# Patient Record
Sex: Female | Born: 1945 | Race: White | Hispanic: No | Marital: Married | Smoking: Former smoker
Health system: Southern US, Community
[De-identification: ages and names within clinical notes are randomized; demographics above are authoritative.]

## PROBLEM LIST (undated history)

## (undated) DIAGNOSIS — E039 Hypothyroidism, unspecified: Secondary | ICD-10-CM

## (undated) DIAGNOSIS — Z780 Asymptomatic menopausal state: Secondary | ICD-10-CM

## (undated) DIAGNOSIS — Z9109 Other allergy status, other than to drugs and biological substances: Secondary | ICD-10-CM

## (undated) DIAGNOSIS — F329 Major depressive disorder, single episode, unspecified: Secondary | ICD-10-CM

## (undated) DIAGNOSIS — F32A Depression, unspecified: Secondary | ICD-10-CM

## (undated) DIAGNOSIS — N6459 Other signs and symptoms in breast: Secondary | ICD-10-CM

## (undated) DIAGNOSIS — I10 Essential (primary) hypertension: Secondary | ICD-10-CM

## (undated) HISTORY — DX: Depression, unspecified: F32.A

## (undated) HISTORY — DX: Hypothyroidism, unspecified: E03.9

## (undated) HISTORY — PX: OTHER SURGICAL HISTORY: SHX169

## (undated) HISTORY — DX: Other allergy status, other than to drugs and biological substances: Z91.09

## (undated) HISTORY — DX: Major depressive disorder, single episode, unspecified: F32.9

## (undated) HISTORY — DX: Essential (primary) hypertension: I10

## (undated) HISTORY — DX: Asymptomatic menopausal state: Z78.0

---

## 2000-02-15 ENCOUNTER — Encounter: Payer: Self-pay | Admitting: Family Medicine

## 2000-02-15 ENCOUNTER — Encounter: Admission: RE | Admit: 2000-02-15 | Discharge: 2000-02-15 | Payer: Self-pay | Admitting: Family Medicine

## 2001-02-15 ENCOUNTER — Encounter: Admission: RE | Admit: 2001-02-15 | Discharge: 2001-02-15 | Payer: Self-pay | Admitting: Family Medicine

## 2001-02-15 ENCOUNTER — Encounter: Payer: Self-pay | Admitting: Family Medicine

## 2001-11-16 ENCOUNTER — Encounter: Payer: Self-pay | Admitting: Family Medicine

## 2001-11-16 ENCOUNTER — Encounter: Admission: RE | Admit: 2001-11-16 | Discharge: 2001-11-16 | Payer: Self-pay | Admitting: Family Medicine

## 2002-02-18 ENCOUNTER — Encounter: Payer: Self-pay | Admitting: Family Medicine

## 2002-02-18 ENCOUNTER — Encounter: Admission: RE | Admit: 2002-02-18 | Discharge: 2002-02-18 | Payer: Self-pay | Admitting: Family Medicine

## 2003-01-22 ENCOUNTER — Ambulatory Visit (HOSPITAL_COMMUNITY): Admission: RE | Admit: 2003-01-22 | Discharge: 2003-01-22 | Payer: Self-pay | Admitting: Gastroenterology

## 2003-02-20 ENCOUNTER — Encounter: Payer: Self-pay | Admitting: Family Medicine

## 2003-02-20 ENCOUNTER — Encounter: Admission: RE | Admit: 2003-02-20 | Discharge: 2003-02-20 | Payer: Self-pay | Admitting: Family Medicine

## 2004-02-24 ENCOUNTER — Encounter: Admission: RE | Admit: 2004-02-24 | Discharge: 2004-02-24 | Payer: Self-pay | Admitting: Family Medicine

## 2005-03-02 ENCOUNTER — Encounter: Admission: RE | Admit: 2005-03-02 | Discharge: 2005-03-02 | Payer: Self-pay | Admitting: Family Medicine

## 2006-03-07 ENCOUNTER — Encounter: Admission: RE | Admit: 2006-03-07 | Discharge: 2006-03-07 | Payer: Self-pay | Admitting: Family Medicine

## 2006-03-20 ENCOUNTER — Encounter: Admission: RE | Admit: 2006-03-20 | Discharge: 2006-03-20 | Payer: Self-pay | Admitting: Family Medicine

## 2007-04-12 ENCOUNTER — Encounter: Admission: RE | Admit: 2007-04-12 | Discharge: 2007-04-12 | Payer: Self-pay | Admitting: Family Medicine

## 2008-01-02 ENCOUNTER — Other Ambulatory Visit: Admission: RE | Admit: 2008-01-02 | Discharge: 2008-01-02 | Payer: Self-pay | Admitting: Family Medicine

## 2008-04-16 ENCOUNTER — Encounter: Admission: RE | Admit: 2008-04-16 | Discharge: 2008-04-16 | Payer: Self-pay | Admitting: Family Medicine

## 2008-08-04 ENCOUNTER — Ambulatory Visit (HOSPITAL_BASED_OUTPATIENT_CLINIC_OR_DEPARTMENT_OTHER): Admission: RE | Admit: 2008-08-04 | Discharge: 2008-08-04 | Payer: Self-pay | Admitting: Urology

## 2009-04-20 ENCOUNTER — Encounter: Admission: RE | Admit: 2009-04-20 | Discharge: 2009-04-20 | Payer: Self-pay | Admitting: Family Medicine

## 2010-01-07 ENCOUNTER — Other Ambulatory Visit: Admission: RE | Admit: 2010-01-07 | Discharge: 2010-01-07 | Payer: Self-pay | Admitting: Family Medicine

## 2010-04-27 ENCOUNTER — Encounter: Admission: RE | Admit: 2010-04-27 | Discharge: 2010-04-27 | Payer: Self-pay | Admitting: Family Medicine

## 2010-06-13 ENCOUNTER — Encounter: Payer: Self-pay | Admitting: Family Medicine

## 2010-09-02 LAB — POCT I-STAT 4, (NA,K, GLUC, HGB,HCT)
Glucose, Bld: 105 mg/dL — ABNORMAL HIGH (ref 70–99)
HCT: 41 % (ref 36.0–46.0)
Hemoglobin: 13.9 g/dL (ref 12.0–15.0)
Potassium: 3.7 mEq/L (ref 3.5–5.1)
Sodium: 141 mEq/L (ref 135–145)

## 2010-10-05 NOTE — Op Note (Signed)
NAMECASIMIRA, Maria Cortez              ACCOUNT NO.:  [id number]   MEDICAL RECORD NO.:  [id number]          PATIENT TYPE:  AMB   LOCATION:  [facility]                         FACILITY:  [facility]   PHYSICIAN:  Mark C. Vernie Ammons, M.D.  DATE OF BIRTH:  [birth year]   DATE OF PROCEDURE:  08/04/2008  DATE OF DISCHARGE:                               OPERATIVE REPORT   PREOPERATIVE DIAGNOSIS:  Stress urinary incontinence.   POSTOPERATIVE DIAGNOSIS:  Stress urinary incontinence.   PROCEDURE:  Transobturator sling.   SURGEON:  Mark C. Vernie Ammons, M.D.   ANESTHESIA:  General.   SPECIMENS:  None.   BLOOD LOSS:  Approximately 100 mL.   DRAINS:  None.   COMPLICATIONS:  None.   INDICATIONS:  The patient is a 65 year old female with stress urinary  incontinence.  She was found to have a mild cystocele on exam and loss  of urethrovesical junction support.  We have discussed treatment options  as well as risks, complications, alternatives and limitations to the  surgery.  She understands and has elected to proceed.   DESCRIPTION OF OPERATION:  After informed consent, the patient was  brought to the major OR, placed on the table and administered general  anesthesia, then moved to the dorsal lithotomy position.  Her genitalia  was sterilely prepped and draped and an official time-out was then  performed.  Next, a 16-French Foley catheter was placed in her bladder  and the bladder was fully drained and a weighted speculum was placed in  the vagina.  I evaluated her cystocele and found that really what she  had was loss of ureterovesical junction support but no real cystocele  component worthy of any form of surgical treatment.  I therefore elected  not to perform any form of anterior repair.  I therefore injected the  subvaginal mucosa with Marcaine with epinephrine, and after allowing  adequate time for epinephrine effect, I made a midline incision over the  mid urethra that was noted by palpating the catheter  pulled snugly  against the bladder neck.  This was then deepened laterally on each side  until I could feel the undersurface of the symphysis pubis through the  incision with my index finger.   Attention was then directed to the inner thigh region where I chose a  location by palpation of the obturator fossa and measured 5 mm lateral  to the midline at the level of the clitoris and made a mark on right and  left sides and then injected Marcaine with epinephrine in this location  as well.  Stab incisions were then made and I then drained the bladder  once again through the Foley catheter.   With the bladder completely drained, I passed the trocar first on the  right side and then left side through the obturator fossa and back  behind the symphysis pubis, and brought it out at the mid urethral level  by palpating this against my index finger.  As the trocar was passed  through, I made sure there was no buttonholing of the vaginal fornix.  I  then attached the sling  material to the trocar and brought these back  out through the stab incisions.   The Foley catheter was removed and the 22-French cystoscope with 70-  degrees lens was inserted in the bladder.  The bladder was then fully  inspected and noted to be free of any tumor, stones or inflammatory  lesions.  There was no evidence of injury, perforation or foreign body  within the bladder.   The cystoscope was removed and the Foley catheter was reinserted.  I  then squirted antibiotic solution through the plastic covering to the  sling and then placed a hemostat under the sling and removed the sheath  on the left side, then made sure there was correct tension, and removed  the sheath on the right-hand side.  I rechecked the tension and noted  there was no undue tension on the urethra with the sling lying at the  mid urethral level.  I therefore irrigated the wound copiously with  antibiotic solution and closed the vaginal incision  with a running  locking 2-0 Vicryl suture.  The Foley catheter was removed and the  patient was awakened and taken to recovery room in stable and  satisfactory condition.  She tolerated the procedure well and there were  no intraoperative complications.   She will be given a prescription for Tylox #36 and Cipro 500 mg b.i.d.  for 5 days.  She will return to my office in followup in 1 week.      Mark C. Vernie Ammons, M.D.  Electronically Signed     MCO/MEDQ  D:  08/04/2008  T:  08/04/2008  Job:  [id number]

## 2010-10-08 NOTE — Op Note (Signed)
   [name], Maria Cortez                          ACCOUNT NO.:  [id number]   MEDICAL RECORD NO.:  [id number]                   PATIENT TYPE:  AMB   LOCATION:  ENDO                                 FACILITY:  [facility]   PHYSICIAN:  John C. Madilyn Fireman, M.D.                 DATE OF BIRTH:  [birth year]   DATE OF PROCEDURE:  01/22/2003  DATE OF DISCHARGE:                                 OPERATIVE REPORT   PROCEDURE:  Colonoscopy.   INDICATION FOR PROCEDURE:  Colon cancer screening in a 65 year old patient  with no recent colon screening.   DESCRIPTION OF PROCEDURE:  The patient was placed in the left lateral  decubitus position and placed on the pulse monitor with continuous low-flow  oxygen delivered by nasal cannula.  She was sedated with 75 mcg IV fentanyl  and 6 mg IV Versed.  The Olympus video colonoscope was inserted into the  rectum and advanced to the cecum, confirmed by transillumination at  McBurney's point and visualization of the ileocecal valve and appendiceal  orifice.  The prep was excellent.  The cecum, ascending, transverse,  descending, and sigmoid colon all appeared normal with no masses, polyps,  diverticula, or other mucosal abnormalities.  The rectum likewise appeared  normal.  Retroflexed view of the anus revealed no obvious internal  hemorrhoids.  The scope was then withdrawn and the patient returned to the  recovery room in stable condition.  She tolerated the procedure well, and  there were no immediate complications.   IMPRESSION:  Normal colonoscopy.   PLAN:  Next colon screening by sigmoidoscopy in five years.                                               John C. Madilyn Fireman, M.D.    JCH/MEDQ  D:  01/22/2003  T:  01/23/2003  Job:  [id number]   cc:   Molly Maduro L. Foy [place], M.D.  [address] [place]  [place] [address]  Fax: [phone]

## 2011-02-07 ENCOUNTER — Other Ambulatory Visit: Payer: Self-pay | Admitting: Family Medicine

## 2011-02-07 ENCOUNTER — Other Ambulatory Visit (HOSPITAL_COMMUNITY)
Admission: RE | Admit: 2011-02-07 | Discharge: 2011-02-07 | Disposition: A | Discharge status: Home / Self Care | Payer: Medicare Other | Source: Ambulatory Visit | Attending: Family Medicine | Admitting: Family Medicine

## 2011-02-07 DIAGNOSIS — Z124 Encounter for screening for malignant neoplasm of cervix: Secondary | ICD-10-CM | POA: Insufficient documentation

## 2011-02-07 DIAGNOSIS — Z1159 Encounter for screening for other viral diseases: Secondary | ICD-10-CM | POA: Insufficient documentation

## 2011-03-24 ENCOUNTER — Other Ambulatory Visit: Payer: Self-pay | Admitting: Family Medicine

## 2011-03-24 DIAGNOSIS — Z1231 Encounter for screening mammogram for malignant neoplasm of breast: Secondary | ICD-10-CM

## 2011-05-02 ENCOUNTER — Ambulatory Visit
Admission: RE | Admit: 2011-05-02 | Discharge: 2011-05-02 | Disposition: A | Discharge status: Home / Self Care | Payer: Medicare Other | Source: Ambulatory Visit | Attending: Family Medicine | Admitting: Family Medicine

## 2011-05-02 DIAGNOSIS — Z1231 Encounter for screening mammogram for malignant neoplasm of breast: Secondary | ICD-10-CM

## 2011-06-09 ENCOUNTER — Other Ambulatory Visit: Payer: Self-pay | Admitting: Dermatology

## 2011-09-08 ENCOUNTER — Ambulatory Visit: Payer: Medicare Other | Attending: Family Medicine | Admitting: Physical Therapy

## 2011-09-08 DIAGNOSIS — M2560 Stiffness of unspecified joint, not elsewhere classified: Secondary | ICD-10-CM | POA: Insufficient documentation

## 2011-09-08 DIAGNOSIS — M542 Cervicalgia: Secondary | ICD-10-CM | POA: Insufficient documentation

## 2011-09-08 DIAGNOSIS — IMO0001 Reserved for inherently not codable concepts without codable children: Secondary | ICD-10-CM | POA: Insufficient documentation

## 2011-09-08 DIAGNOSIS — M25519 Pain in unspecified shoulder: Secondary | ICD-10-CM | POA: Insufficient documentation

## 2011-09-09 ENCOUNTER — Ambulatory Visit: Payer: Medicare Other | Admitting: Physical Therapy

## 2011-09-12 ENCOUNTER — Ambulatory Visit: Payer: Medicare Other

## 2011-09-15 ENCOUNTER — Ambulatory Visit: Payer: Medicare Other | Admitting: Physical Therapy

## 2011-09-20 ENCOUNTER — Ambulatory Visit: Payer: Medicare Other | Admitting: Physical Therapy

## 2011-09-22 ENCOUNTER — Ambulatory Visit: Payer: Medicare Other | Attending: Family Medicine | Admitting: Physical Therapy

## 2011-09-22 DIAGNOSIS — M2560 Stiffness of unspecified joint, not elsewhere classified: Secondary | ICD-10-CM | POA: Insufficient documentation

## 2011-09-22 DIAGNOSIS — IMO0001 Reserved for inherently not codable concepts without codable children: Secondary | ICD-10-CM | POA: Insufficient documentation

## 2011-09-22 DIAGNOSIS — M25519 Pain in unspecified shoulder: Secondary | ICD-10-CM | POA: Insufficient documentation

## 2011-09-22 DIAGNOSIS — M542 Cervicalgia: Secondary | ICD-10-CM | POA: Insufficient documentation

## 2011-09-27 ENCOUNTER — Ambulatory Visit: Payer: Medicare Other | Admitting: Physical Therapy

## 2011-09-29 ENCOUNTER — Ambulatory Visit: Payer: Medicare Other | Admitting: Physical Therapy

## 2011-10-04 ENCOUNTER — Ambulatory Visit: Payer: Medicare Other | Admitting: Physical Therapy

## 2011-10-06 ENCOUNTER — Ambulatory Visit: Payer: Medicare Other | Admitting: Physical Therapy

## 2011-11-07 ENCOUNTER — Other Ambulatory Visit (HOSPITAL_COMMUNITY): Payer: Self-pay | Admitting: Family Medicine

## 2011-11-07 ENCOUNTER — Ambulatory Visit (HOSPITAL_COMMUNITY)
Admission: RE | Admit: 2011-11-07 | Discharge: 2011-11-07 | Disposition: A | Discharge status: Home / Self Care | Payer: Medicare Other | Source: Ambulatory Visit | Attending: Family Medicine | Admitting: Family Medicine

## 2011-11-07 DIAGNOSIS — R911 Solitary pulmonary nodule: Secondary | ICD-10-CM | POA: Insufficient documentation

## 2011-11-07 DIAGNOSIS — R0902 Hypoxemia: Secondary | ICD-10-CM

## 2011-11-07 DIAGNOSIS — I700 Atherosclerosis of aorta: Secondary | ICD-10-CM | POA: Insufficient documentation

## 2011-11-07 DIAGNOSIS — I517 Cardiomegaly: Secondary | ICD-10-CM | POA: Insufficient documentation

## 2011-11-07 MED ORDER — IOHEXOL 350 MG/ML SOLN
100.0000 mL | Freq: Once | INTRAVENOUS | Status: AC | PRN
  Administered 2011-11-07: 80 mL via INTRAVENOUS

## 2011-11-07 MED ORDER — IOHEXOL 350 MG/ML SOLN: 100.0000 mL | Freq: Once | INTRAVENOUS | Status: AC | PRN

## 2012-03-22 ENCOUNTER — Other Ambulatory Visit: Payer: Self-pay | Admitting: Family Medicine

## 2012-03-22 DIAGNOSIS — Z1231 Encounter for screening mammogram for malignant neoplasm of breast: Secondary | ICD-10-CM

## 2012-05-03 ENCOUNTER — Ambulatory Visit
Admission: RE | Admit: 2012-05-03 | Discharge: 2012-05-03 | Disposition: A | Discharge status: Home / Self Care | Payer: Medicare Other | Source: Ambulatory Visit | Attending: Family Medicine | Admitting: Family Medicine

## 2012-05-03 DIAGNOSIS — Z1231 Encounter for screening mammogram for malignant neoplasm of breast: Secondary | ICD-10-CM

## 2012-05-09 ENCOUNTER — Other Ambulatory Visit: Payer: Self-pay | Admitting: Family Medicine

## 2012-05-09 DIAGNOSIS — R928 Other abnormal and inconclusive findings on diagnostic imaging of breast: Secondary | ICD-10-CM

## 2012-05-21 ENCOUNTER — Ambulatory Visit
Admission: RE | Admit: 2012-05-21 | Discharge: 2012-05-21 | Disposition: A | Discharge status: Home / Self Care | Payer: Medicare Other | Source: Ambulatory Visit | Attending: Family Medicine | Admitting: Family Medicine

## 2012-05-21 DIAGNOSIS — R928 Other abnormal and inconclusive findings on diagnostic imaging of breast: Secondary | ICD-10-CM

## 2012-06-18 ENCOUNTER — Other Ambulatory Visit: Payer: Self-pay | Admitting: Dermatology

## 2012-10-02 ENCOUNTER — Ambulatory Visit: Payer: Medicare Other

## 2012-10-04 ENCOUNTER — Ambulatory Visit: Payer: Medicare Other | Attending: Family Medicine

## 2012-10-04 DIAGNOSIS — IMO0001 Reserved for inherently not codable concepts without codable children: Secondary | ICD-10-CM | POA: Insufficient documentation

## 2012-10-04 DIAGNOSIS — M25519 Pain in unspecified shoulder: Secondary | ICD-10-CM | POA: Insufficient documentation

## 2012-10-04 DIAGNOSIS — R5381 Other malaise: Secondary | ICD-10-CM | POA: Insufficient documentation

## 2012-10-04 DIAGNOSIS — M25619 Stiffness of unspecified shoulder, not elsewhere classified: Secondary | ICD-10-CM | POA: Insufficient documentation

## 2012-10-09 ENCOUNTER — Ambulatory Visit: Payer: Medicare Other | Admitting: Physical Therapy

## 2012-10-11 ENCOUNTER — Ambulatory Visit: Payer: Medicare Other | Admitting: Physical Therapy

## 2012-10-18 ENCOUNTER — Ambulatory Visit: Payer: Medicare Other

## 2012-10-22 ENCOUNTER — Ambulatory Visit: Payer: Medicare Other | Attending: Family Medicine | Admitting: Physical Therapy

## 2012-10-22 DIAGNOSIS — R5381 Other malaise: Secondary | ICD-10-CM | POA: Insufficient documentation

## 2012-10-22 DIAGNOSIS — IMO0001 Reserved for inherently not codable concepts without codable children: Secondary | ICD-10-CM | POA: Insufficient documentation

## 2012-10-22 DIAGNOSIS — M25619 Stiffness of unspecified shoulder, not elsewhere classified: Secondary | ICD-10-CM | POA: Insufficient documentation

## 2012-10-22 DIAGNOSIS — M25519 Pain in unspecified shoulder: Secondary | ICD-10-CM | POA: Insufficient documentation

## 2012-10-25 ENCOUNTER — Ambulatory Visit: Payer: Medicare Other | Admitting: Physical Therapy

## 2012-10-29 ENCOUNTER — Encounter: Payer: Medicare Other | Admitting: Physical Therapy

## 2012-11-01 ENCOUNTER — Ambulatory Visit: Payer: Medicare Other | Admitting: Physical Therapy

## 2012-11-05 ENCOUNTER — Ambulatory Visit: Payer: Medicare Other | Admitting: Physical Therapy

## 2012-11-08 ENCOUNTER — Ambulatory Visit: Payer: Medicare Other

## 2013-03-19 ENCOUNTER — Other Ambulatory Visit: Payer: Self-pay

## 2013-03-19 DIAGNOSIS — Z1231 Encounter for screening mammogram for malignant neoplasm of breast: Secondary | ICD-10-CM

## 2013-05-06 ENCOUNTER — Ambulatory Visit
Admission: RE | Admit: 2013-05-06 | Discharge: 2013-05-06 | Disposition: A | Discharge status: Home / Self Care | Payer: Medicare Other | Source: Ambulatory Visit

## 2013-05-06 DIAGNOSIS — Z1231 Encounter for screening mammogram for malignant neoplasm of breast: Secondary | ICD-10-CM

## 2014-04-07 ENCOUNTER — Other Ambulatory Visit: Payer: Self-pay

## 2014-04-07 DIAGNOSIS — Z1231 Encounter for screening mammogram for malignant neoplasm of breast: Secondary | ICD-10-CM

## 2014-05-07 ENCOUNTER — Ambulatory Visit: Payer: Medicare Other

## 2014-05-22 ENCOUNTER — Ambulatory Visit
Admission: RE | Admit: 2014-05-22 | Discharge: 2014-05-22 | Disposition: A | Discharge status: Home / Self Care | Payer: Medicare HMO | Source: Ambulatory Visit

## 2014-05-22 DIAGNOSIS — Z1231 Encounter for screening mammogram for malignant neoplasm of breast: Secondary | ICD-10-CM

## 2014-05-27 ENCOUNTER — Other Ambulatory Visit: Payer: Self-pay | Admitting: Family Medicine

## 2014-05-27 DIAGNOSIS — R928 Other abnormal and inconclusive findings on diagnostic imaging of breast: Secondary | ICD-10-CM

## 2014-06-05 ENCOUNTER — Ambulatory Visit
Admission: RE | Admit: 2014-06-05 | Discharge: 2014-06-05 | Disposition: A | Discharge status: Home / Self Care | Payer: Medicare HMO | Source: Ambulatory Visit | Attending: Family Medicine | Admitting: Family Medicine

## 2014-06-05 DIAGNOSIS — R928 Other abnormal and inconclusive findings on diagnostic imaging of breast: Secondary | ICD-10-CM

## 2014-07-17 ENCOUNTER — Ambulatory Visit: Payer: Self-pay | Admitting: Podiatry

## 2014-07-24 ENCOUNTER — Ambulatory Visit (INDEPENDENT_AMBULATORY_CARE_PROVIDER_SITE_OTHER): Payer: Medicare HMO | Admitting: Podiatry

## 2014-07-24 ENCOUNTER — Encounter: Payer: Self-pay | Admitting: Podiatry

## 2014-07-24 VITALS — BP 127/66 | HR 68 | Resp 12

## 2014-07-24 DIAGNOSIS — M779 Enthesopathy, unspecified: Secondary | ICD-10-CM

## 2014-07-24 MED ORDER — MELOXICAM 15 MG PO TABS: 15.0000 mg | ORAL_TABLET | Freq: Every day | ORAL | Status: AC

## 2014-07-24 NOTE — Progress Notes (Signed)
Subjective:     Patient ID: Maria Cortez, female   DOB: Apr 10, 1946, 69 y.o.   MRN: [id number]  HPI patient states that she is getting pain in her right hip and that her feet bother her and has a history of heel pain. Orthotics are proximally 70-68 years old and no longer supporting her arch properly   Review of Systems  All other systems reviewed and are negative.      Objective:   Physical Exam  Constitutional: She is oriented to person, place, and time.  Cardiovascular: Intact distal pulses.   Musculoskeletal: Normal range of motion.  Neurological: She is oriented to person, place, and time.  Skin: Skin is warm.  Nursing note and vitals reviewed.  neurovascular status intact with muscle strength adequate and range of motion within normal limits. Patient is noted to have good digital perfusion is well oriented 3 and is noted to have mild discomfort plantar of the feet bilateral with depression of the arch and right hip pain which is prevalent from a recent perspective     Assessment:     Tendinitis with fallen arches and hip pain right    Plan:     H&P and conditions discussed explained x-rays reviewed. Today I scanned for new orthotics and explained usage and reappoint when they are ready

## 2014-07-24 NOTE — Progress Notes (Signed)
   Subjective:    Patient ID: Maria Cortez, female    DOB: Mar 08, 1946, 69 y.o.   MRN: [id number]  HPI PT STATED HAVE HISTORY B/L PLANTAR FASCIITIS AND HAVE ORTHOTICS SINCE 2009. PT STATED HAVING RT HIP PAIN AND NEEDED  NEW PAIR OF ORTHOTICS.   Review of Systems  All other systems reviewed and are negative.      Objective:   Physical Exam        Assessment & Plan:

## 2014-08-13 ENCOUNTER — Ambulatory Visit: Payer: Medicare HMO | Admitting: *Deleted

## 2014-08-13 DIAGNOSIS — M779 Enthesopathy, unspecified: Secondary | ICD-10-CM

## 2014-08-13 NOTE — Progress Notes (Signed)
Patient ID: Maria Cortez, female   DOB: 02/11/1946, 69 y.o.   MRN: [id number] PICKING UP INSERTS

## 2014-08-13 NOTE — Patient Instructions (Signed)

## 2015-03-17 DIAGNOSIS — H2513 Age-related nuclear cataract, bilateral: Secondary | ICD-10-CM | POA: Diagnosis not present

## 2015-04-24 DIAGNOSIS — I10 Essential (primary) hypertension: Secondary | ICD-10-CM | POA: Diagnosis not present

## 2015-04-24 DIAGNOSIS — Z136 Encounter for screening for cardiovascular disorders: Secondary | ICD-10-CM | POA: Diagnosis not present

## 2015-04-24 DIAGNOSIS — E039 Hypothyroidism, unspecified: Secondary | ICD-10-CM | POA: Diagnosis not present

## 2015-04-29 DIAGNOSIS — E039 Hypothyroidism, unspecified: Secondary | ICD-10-CM | POA: Diagnosis not present

## 2015-04-29 DIAGNOSIS — Z0000 Encounter for general adult medical examination without abnormal findings: Secondary | ICD-10-CM | POA: Diagnosis not present

## 2015-04-29 DIAGNOSIS — I10 Essential (primary) hypertension: Secondary | ICD-10-CM | POA: Diagnosis not present

## 2015-05-06 ENCOUNTER — Other Ambulatory Visit: Payer: Self-pay

## 2015-05-06 DIAGNOSIS — Z1231 Encounter for screening mammogram for malignant neoplasm of breast: Secondary | ICD-10-CM

## 2015-05-11 ENCOUNTER — Other Ambulatory Visit: Payer: Self-pay | Admitting: Family Medicine

## 2015-05-11 DIAGNOSIS — E2839 Other primary ovarian failure: Secondary | ICD-10-CM

## 2015-06-09 ENCOUNTER — Ambulatory Visit: Payer: Medicare HMO

## 2015-06-18 ENCOUNTER — Ambulatory Visit
Admission: RE | Admit: 2015-06-18 | Discharge: 2015-06-18 | Disposition: A | Discharge status: Home / Self Care | Payer: Medicare HMO | Source: Ambulatory Visit | Attending: Family Medicine | Admitting: Family Medicine

## 2015-06-18 ENCOUNTER — Ambulatory Visit
Admission: RE | Admit: 2015-06-18 | Discharge: 2015-06-18 | Disposition: A | Discharge status: Home / Self Care | Payer: Medicare HMO | Source: Ambulatory Visit

## 2015-06-18 DIAGNOSIS — M8589 Other specified disorders of bone density and structure, multiple sites: Secondary | ICD-10-CM | POA: Diagnosis not present

## 2015-06-18 DIAGNOSIS — Z1231 Encounter for screening mammogram for malignant neoplasm of breast: Secondary | ICD-10-CM | POA: Diagnosis not present

## 2015-06-18 DIAGNOSIS — E2839 Other primary ovarian failure: Secondary | ICD-10-CM

## 2015-10-02 DIAGNOSIS — E039 Hypothyroidism, unspecified: Secondary | ICD-10-CM | POA: Diagnosis not present

## 2015-10-02 DIAGNOSIS — Z0000 Encounter for general adult medical examination without abnormal findings: Secondary | ICD-10-CM | POA: Diagnosis not present

## 2015-10-02 DIAGNOSIS — I10 Essential (primary) hypertension: Secondary | ICD-10-CM | POA: Diagnosis not present

## 2015-10-02 DIAGNOSIS — R69 Illness, unspecified: Secondary | ICD-10-CM | POA: Diagnosis not present

## 2015-10-26 DIAGNOSIS — L309 Dermatitis, unspecified: Secondary | ICD-10-CM | POA: Diagnosis not present

## 2015-10-26 DIAGNOSIS — Z872 Personal history of diseases of the skin and subcutaneous tissue: Secondary | ICD-10-CM | POA: Diagnosis not present

## 2015-10-26 DIAGNOSIS — E039 Hypothyroidism, unspecified: Secondary | ICD-10-CM | POA: Diagnosis not present

## 2015-10-26 DIAGNOSIS — L821 Other seborrheic keratosis: Secondary | ICD-10-CM | POA: Diagnosis not present

## 2015-10-26 DIAGNOSIS — D225 Melanocytic nevi of trunk: Secondary | ICD-10-CM | POA: Diagnosis not present

## 2015-10-26 DIAGNOSIS — L219 Seborrheic dermatitis, unspecified: Secondary | ICD-10-CM | POA: Diagnosis not present

## 2015-10-28 DIAGNOSIS — E039 Hypothyroidism, unspecified: Secondary | ICD-10-CM | POA: Diagnosis not present

## 2015-10-28 DIAGNOSIS — I10 Essential (primary) hypertension: Secondary | ICD-10-CM | POA: Diagnosis not present

## 2016-02-01 DIAGNOSIS — R69 Illness, unspecified: Secondary | ICD-10-CM | POA: Diagnosis not present

## 2016-03-18 DIAGNOSIS — Z23 Encounter for immunization: Secondary | ICD-10-CM | POA: Diagnosis not present

## 2016-04-20 DIAGNOSIS — H524 Presbyopia: Secondary | ICD-10-CM | POA: Diagnosis not present

## 2016-04-20 DIAGNOSIS — H3562 Retinal hemorrhage, left eye: Secondary | ICD-10-CM | POA: Diagnosis not present

## 2016-04-20 DIAGNOSIS — H25013 Cortical age-related cataract, bilateral: Secondary | ICD-10-CM | POA: Diagnosis not present

## 2016-04-20 DIAGNOSIS — H2513 Age-related nuclear cataract, bilateral: Secondary | ICD-10-CM | POA: Diagnosis not present

## 2016-04-20 DIAGNOSIS — H1859 Other hereditary corneal dystrophies: Secondary | ICD-10-CM | POA: Diagnosis not present

## 2016-04-20 DIAGNOSIS — H3552 Pigmentary retinal dystrophy: Secondary | ICD-10-CM | POA: Diagnosis not present

## 2016-04-20 DIAGNOSIS — H52223 Regular astigmatism, bilateral: Secondary | ICD-10-CM | POA: Diagnosis not present

## 2016-04-20 DIAGNOSIS — H5203 Hypermetropia, bilateral: Secondary | ICD-10-CM | POA: Diagnosis not present

## 2016-05-04 DIAGNOSIS — Z0000 Encounter for general adult medical examination without abnormal findings: Secondary | ICD-10-CM | POA: Diagnosis not present

## 2016-05-04 DIAGNOSIS — I10 Essential (primary) hypertension: Secondary | ICD-10-CM | POA: Diagnosis not present

## 2016-05-04 DIAGNOSIS — E039 Hypothyroidism, unspecified: Secondary | ICD-10-CM | POA: Diagnosis not present

## 2016-05-27 ENCOUNTER — Other Ambulatory Visit: Payer: Self-pay | Admitting: Family Medicine

## 2016-05-27 DIAGNOSIS — Z1231 Encounter for screening mammogram for malignant neoplasm of breast: Secondary | ICD-10-CM

## 2016-06-22 ENCOUNTER — Ambulatory Visit
Admission: RE | Admit: 2016-06-22 | Discharge: 2016-06-22 | Disposition: A | Discharge status: Home / Self Care | Payer: Medicare HMO | Source: Ambulatory Visit | Attending: Family Medicine | Admitting: Family Medicine

## 2016-06-22 DIAGNOSIS — Z1231 Encounter for screening mammogram for malignant neoplasm of breast: Secondary | ICD-10-CM

## 2016-07-20 DIAGNOSIS — H3562 Retinal hemorrhage, left eye: Secondary | ICD-10-CM | POA: Diagnosis not present

## 2016-08-29 DIAGNOSIS — R69 Illness, unspecified: Secondary | ICD-10-CM | POA: Diagnosis not present

## 2016-09-14 DIAGNOSIS — R69 Illness, unspecified: Secondary | ICD-10-CM | POA: Diagnosis not present

## 2016-11-07 DIAGNOSIS — E039 Hypothyroidism, unspecified: Secondary | ICD-10-CM | POA: Diagnosis not present

## 2016-11-07 DIAGNOSIS — I10 Essential (primary) hypertension: Secondary | ICD-10-CM | POA: Diagnosis not present

## 2016-11-07 DIAGNOSIS — R69 Illness, unspecified: Secondary | ICD-10-CM | POA: Diagnosis not present

## 2016-12-20 DIAGNOSIS — R69 Illness, unspecified: Secondary | ICD-10-CM | POA: Diagnosis not present

## 2017-02-22 DIAGNOSIS — D225 Melanocytic nevi of trunk: Secondary | ICD-10-CM | POA: Diagnosis not present

## 2017-02-22 DIAGNOSIS — L814 Other melanin hyperpigmentation: Secondary | ICD-10-CM | POA: Diagnosis not present

## 2017-02-22 DIAGNOSIS — L219 Seborrheic dermatitis, unspecified: Secondary | ICD-10-CM | POA: Diagnosis not present

## 2017-02-22 DIAGNOSIS — L821 Other seborrheic keratosis: Secondary | ICD-10-CM | POA: Diagnosis not present

## 2017-02-22 DIAGNOSIS — Z23 Encounter for immunization: Secondary | ICD-10-CM | POA: Diagnosis not present

## 2017-02-22 DIAGNOSIS — D1801 Hemangioma of skin and subcutaneous tissue: Secondary | ICD-10-CM | POA: Diagnosis not present

## 2017-02-22 DIAGNOSIS — L309 Dermatitis, unspecified: Secondary | ICD-10-CM | POA: Diagnosis not present

## 2017-04-12 DIAGNOSIS — Z23 Encounter for immunization: Secondary | ICD-10-CM | POA: Diagnosis not present

## 2017-04-18 DIAGNOSIS — M25561 Pain in right knee: Secondary | ICD-10-CM | POA: Diagnosis not present

## 2017-04-18 DIAGNOSIS — M25551 Pain in right hip: Secondary | ICD-10-CM | POA: Diagnosis not present

## 2017-04-20 DIAGNOSIS — K5900 Constipation, unspecified: Secondary | ICD-10-CM | POA: Diagnosis not present

## 2017-04-20 DIAGNOSIS — J309 Allergic rhinitis, unspecified: Secondary | ICD-10-CM | POA: Diagnosis not present

## 2017-04-20 DIAGNOSIS — R69 Illness, unspecified: Secondary | ICD-10-CM | POA: Diagnosis not present

## 2017-04-20 DIAGNOSIS — R32 Unspecified urinary incontinence: Secondary | ICD-10-CM | POA: Diagnosis not present

## 2017-04-20 DIAGNOSIS — Z0000 Encounter for general adult medical examination without abnormal findings: Secondary | ICD-10-CM | POA: Diagnosis not present

## 2017-04-20 DIAGNOSIS — M1990 Unspecified osteoarthritis, unspecified site: Secondary | ICD-10-CM | POA: Diagnosis not present

## 2017-04-20 DIAGNOSIS — M25559 Pain in unspecified hip: Secondary | ICD-10-CM | POA: Diagnosis not present

## 2017-04-20 DIAGNOSIS — E039 Hypothyroidism, unspecified: Secondary | ICD-10-CM | POA: Diagnosis not present

## 2017-04-20 DIAGNOSIS — L219 Seborrheic dermatitis, unspecified: Secondary | ICD-10-CM | POA: Diagnosis not present

## 2017-04-20 DIAGNOSIS — I10 Essential (primary) hypertension: Secondary | ICD-10-CM | POA: Diagnosis not present

## 2017-05-26 ENCOUNTER — Other Ambulatory Visit: Payer: Self-pay | Admitting: Family Medicine

## 2017-05-26 DIAGNOSIS — Z1231 Encounter for screening mammogram for malignant neoplasm of breast: Secondary | ICD-10-CM

## 2017-05-29 DIAGNOSIS — H2513 Age-related nuclear cataract, bilateral: Secondary | ICD-10-CM | POA: Diagnosis not present

## 2017-05-31 DIAGNOSIS — I10 Essential (primary) hypertension: Secondary | ICD-10-CM | POA: Diagnosis not present

## 2017-05-31 DIAGNOSIS — Z1159 Encounter for screening for other viral diseases: Secondary | ICD-10-CM | POA: Diagnosis not present

## 2017-05-31 DIAGNOSIS — E039 Hypothyroidism, unspecified: Secondary | ICD-10-CM | POA: Diagnosis not present

## 2017-05-31 DIAGNOSIS — R69 Illness, unspecified: Secondary | ICD-10-CM | POA: Diagnosis not present

## 2017-05-31 DIAGNOSIS — Z23 Encounter for immunization: Secondary | ICD-10-CM | POA: Diagnosis not present

## 2017-05-31 DIAGNOSIS — Z0000 Encounter for general adult medical examination without abnormal findings: Secondary | ICD-10-CM | POA: Diagnosis not present

## 2017-06-14 DIAGNOSIS — R69 Illness, unspecified: Secondary | ICD-10-CM | POA: Diagnosis not present

## 2017-06-15 ENCOUNTER — Ambulatory Visit: Payer: Medicare HMO | Admitting: Podiatry

## 2017-06-16 ENCOUNTER — Encounter: Payer: Self-pay | Admitting: Podiatry

## 2017-06-16 ENCOUNTER — Telehealth: Payer: Self-pay | Admitting: Podiatry

## 2017-06-16 ENCOUNTER — Ambulatory Visit: Payer: Medicare HMO | Admitting: Podiatry

## 2017-06-16 ENCOUNTER — Ambulatory Visit (INDEPENDENT_AMBULATORY_CARE_PROVIDER_SITE_OTHER): Payer: Medicare HMO

## 2017-06-16 DIAGNOSIS — Q828 Other specified congenital malformations of skin: Secondary | ICD-10-CM

## 2017-06-16 DIAGNOSIS — M722 Plantar fascial fibromatosis: Secondary | ICD-10-CM

## 2017-06-16 DIAGNOSIS — M79672 Pain in left foot: Secondary | ICD-10-CM

## 2017-06-16 MED ORDER — TRIAMCINOLONE ACETONIDE 10 MG/ML IJ SUSP
10.0000 mg | Freq: Once | INTRAMUSCULAR | Status: AC
  Administered 2017-06-16: 10 mg

## 2017-06-16 NOTE — Telephone Encounter (Signed)
I informed pt, she should treat herself like an athlete, decrease her time and distance and strenuousness of the walk and no hills, to stretch before and after gently, and take antiinflammatory she could tolerate. Pt states she uses aleve and I told her that would be fine.

## 2017-06-16 NOTE — Patient Instructions (Signed)

## 2017-06-16 NOTE — Telephone Encounter (Signed)
I saw Dr. Paulla Dolly this morning and neglected to ask him an important question. I walk about 5-6 days a week for about a mile and a half, sometimes more. I just need to know how long or if I should stay off the walking until my foot is through or what would Dr. Paulla Dolly recommend? I would like it if you would call me back and leave a message if I don't answer. The number is [phone]. Thank you.

## 2017-06-16 NOTE — Progress Notes (Signed)
Subjective:   Patient ID: Maria Cortez, female   DOB: 72 y.o.   MRN: [id number]   HPI Patient presents with one-month history of significant pain in the left plantar fascia with no history of injury   ROS      Objective:  Physical Exam  Exquisite discomfort upon palpation plantar fascial left     Assessment:  Plantar fasciitis left acute nature     Plan:  Reviewed condition injected the plantar fascia 3 mg Kenalog 5 mg Xylocaine applied fascial brace and gave instructions on physical therapy  X-rays indicate small spur with no indication of stress fracture arthritis

## 2017-06-26 ENCOUNTER — Ambulatory Visit
Admission: RE | Admit: 2017-06-26 | Discharge: 2017-06-26 | Disposition: A | Discharge status: Home / Self Care | Payer: Medicare HMO | Source: Ambulatory Visit | Attending: Family Medicine | Admitting: Family Medicine

## 2017-06-26 DIAGNOSIS — Z1231 Encounter for screening mammogram for malignant neoplasm of breast: Secondary | ICD-10-CM

## 2017-06-26 HISTORY — DX: Other signs and symptoms in breast: N64.59

## 2017-07-12 DIAGNOSIS — R69 Illness, unspecified: Secondary | ICD-10-CM | POA: Diagnosis not present

## 2017-11-06 ENCOUNTER — Telehealth: Payer: Self-pay | Admitting: *Deleted

## 2017-11-06 NOTE — Telephone Encounter (Signed)
Pt states her left heel is hurting again and she has an appt on 11/09/2017, is wearing the brace, and using Aleve, what else can she do.

## 2017-11-06 NOTE — Telephone Encounter (Signed)
I told pt she was doing a lot of the right things to get well, to also add ice 3-4 times daily for 15-20 minutes protecting the skin from the ice with a light cloth. Pt states understanding.

## 2017-11-09 ENCOUNTER — Encounter: Payer: Self-pay | Admitting: Podiatry

## 2017-11-09 ENCOUNTER — Ambulatory Visit: Payer: Medicare HMO | Admitting: Podiatry

## 2017-11-09 DIAGNOSIS — M722 Plantar fascial fibromatosis: Secondary | ICD-10-CM

## 2017-11-09 MED ORDER — TRIAMCINOLONE ACETONIDE 10 MG/ML IJ SUSP
10.0000 mg | Freq: Once | INTRAMUSCULAR | Status: AC
  Administered 2017-11-09: 10 mg

## 2017-11-09 NOTE — Progress Notes (Signed)
Subjective:   Patient ID: Maria Cortez, female   DOB: 72 y.o.   MRN: [id number]   HPI Patient presents stating her left heel has started to hurt her again in the last several weeks and is hard to walk on   ROS      Objective:  Physical Exam  Neurovascular status intact with exquisite discomfort plantar aspect left heel at the insertional point of the tendon into the calcaneus     Assessment:  Acute plantar fasciitis left     Plan:  Injected the plantar fascial left 3 mg Kenalog 5 mg Xylocaine advised on physical therapy anti-inflammatories supportive shoe and reappoint as needed

## 2017-11-29 DIAGNOSIS — M19049 Primary osteoarthritis, unspecified hand: Secondary | ICD-10-CM | POA: Diagnosis not present

## 2017-11-29 DIAGNOSIS — I10 Essential (primary) hypertension: Secondary | ICD-10-CM | POA: Diagnosis not present

## 2017-11-29 DIAGNOSIS — R911 Solitary pulmonary nodule: Secondary | ICD-10-CM | POA: Diagnosis not present

## 2017-11-29 DIAGNOSIS — E039 Hypothyroidism, unspecified: Secondary | ICD-10-CM | POA: Diagnosis not present

## 2017-12-01 ENCOUNTER — Other Ambulatory Visit (HOSPITAL_COMMUNITY): Payer: Self-pay | Admitting: Family Medicine

## 2017-12-01 DIAGNOSIS — R911 Solitary pulmonary nodule: Secondary | ICD-10-CM

## 2017-12-08 ENCOUNTER — Ambulatory Visit (HOSPITAL_COMMUNITY)
Admission: RE | Admit: 2017-12-08 | Discharge: 2017-12-08 | Disposition: A | Discharge status: Home / Self Care | Payer: Medicare HMO | Source: Ambulatory Visit | Attending: Family Medicine | Admitting: Family Medicine

## 2017-12-08 DIAGNOSIS — R918 Other nonspecific abnormal finding of lung field: Secondary | ICD-10-CM | POA: Insufficient documentation

## 2017-12-08 DIAGNOSIS — I700 Atherosclerosis of aorta: Secondary | ICD-10-CM | POA: Diagnosis not present

## 2017-12-08 DIAGNOSIS — R911 Solitary pulmonary nodule: Secondary | ICD-10-CM

## 2017-12-08 MED ORDER — IOHEXOL 300 MG/ML  SOLN
75.0000 mL | Freq: Once | INTRAMUSCULAR | Status: AC | PRN
  Administered 2017-12-08: 75 mL via INTRAVENOUS

## 2018-02-22 DIAGNOSIS — L409 Psoriasis, unspecified: Secondary | ICD-10-CM | POA: Diagnosis not present

## 2018-02-22 DIAGNOSIS — L821 Other seborrheic keratosis: Secondary | ICD-10-CM | POA: Diagnosis not present

## 2018-02-22 DIAGNOSIS — D1801 Hemangioma of skin and subcutaneous tissue: Secondary | ICD-10-CM | POA: Diagnosis not present

## 2018-02-22 DIAGNOSIS — D225 Melanocytic nevi of trunk: Secondary | ICD-10-CM | POA: Diagnosis not present

## 2018-02-22 DIAGNOSIS — L570 Actinic keratosis: Secondary | ICD-10-CM | POA: Diagnosis not present

## 2018-02-22 DIAGNOSIS — L814 Other melanin hyperpigmentation: Secondary | ICD-10-CM | POA: Diagnosis not present

## 2018-02-22 DIAGNOSIS — Z23 Encounter for immunization: Secondary | ICD-10-CM | POA: Diagnosis not present

## 2018-05-02 DIAGNOSIS — Z23 Encounter for immunization: Secondary | ICD-10-CM | POA: Diagnosis not present

## 2018-05-02 DIAGNOSIS — L409 Psoriasis, unspecified: Secondary | ICD-10-CM | POA: Diagnosis not present

## 2018-05-02 DIAGNOSIS — Z79899 Other long term (current) drug therapy: Secondary | ICD-10-CM | POA: Diagnosis not present

## 2018-05-30 DIAGNOSIS — Z79899 Other long term (current) drug therapy: Secondary | ICD-10-CM | POA: Diagnosis not present

## 2018-05-30 DIAGNOSIS — L409 Psoriasis, unspecified: Secondary | ICD-10-CM | POA: Diagnosis not present

## 2018-06-06 ENCOUNTER — Other Ambulatory Visit: Payer: Self-pay | Admitting: Family Medicine

## 2018-06-06 DIAGNOSIS — Z1231 Encounter for screening mammogram for malignant neoplasm of breast: Secondary | ICD-10-CM

## 2018-06-20 DIAGNOSIS — E039 Hypothyroidism, unspecified: Secondary | ICD-10-CM | POA: Diagnosis not present

## 2018-06-20 DIAGNOSIS — Z0000 Encounter for general adult medical examination without abnormal findings: Secondary | ICD-10-CM | POA: Diagnosis not present

## 2018-06-20 DIAGNOSIS — R69 Illness, unspecified: Secondary | ICD-10-CM | POA: Diagnosis not present

## 2018-06-20 DIAGNOSIS — I10 Essential (primary) hypertension: Secondary | ICD-10-CM | POA: Diagnosis not present

## 2018-06-20 DIAGNOSIS — L409 Psoriasis, unspecified: Secondary | ICD-10-CM | POA: Diagnosis not present

## 2018-06-27 ENCOUNTER — Ambulatory Visit
Admission: RE | Admit: 2018-06-27 | Discharge: 2018-06-27 | Disposition: A | Discharge status: Home / Self Care | Payer: Medicare HMO | Source: Ambulatory Visit | Attending: Family Medicine | Admitting: Family Medicine

## 2018-06-27 DIAGNOSIS — Z1231 Encounter for screening mammogram for malignant neoplasm of breast: Secondary | ICD-10-CM

## 2018-07-23 DIAGNOSIS — M24572 Contracture, left ankle: Secondary | ICD-10-CM | POA: Diagnosis not present

## 2018-07-23 DIAGNOSIS — M722 Plantar fascial fibromatosis: Secondary | ICD-10-CM | POA: Diagnosis not present

## 2018-07-23 DIAGNOSIS — M65872 Other synovitis and tenosynovitis, left ankle and foot: Secondary | ICD-10-CM | POA: Diagnosis not present

## 2018-08-03 ENCOUNTER — Telehealth: Payer: Self-pay | Admitting: Podiatry

## 2018-08-03 DIAGNOSIS — M65872 Other synovitis and tenosynovitis, left ankle and foot: Secondary | ICD-10-CM | POA: Diagnosis not present

## 2018-08-03 DIAGNOSIS — M24572 Contracture, left ankle: Secondary | ICD-10-CM | POA: Diagnosis not present

## 2018-08-03 DIAGNOSIS — M722 Plantar fascial fibromatosis: Secondary | ICD-10-CM | POA: Diagnosis not present

## 2018-08-03 NOTE — Telephone Encounter (Signed)
Left voicemail on medical records machine with our fax number.

## 2018-08-03 NOTE — Telephone Encounter (Signed)
Maria Cortez from [place] calling to confirm fax number so they can fax their clinicals so pt can see Dr. Paulla Dolly while in town.

## 2018-08-10 ENCOUNTER — Ambulatory Visit (INDEPENDENT_AMBULATORY_CARE_PROVIDER_SITE_OTHER): Payer: Medicare HMO

## 2018-08-10 ENCOUNTER — Encounter: Payer: Self-pay | Admitting: Podiatry

## 2018-08-10 ENCOUNTER — Other Ambulatory Visit: Payer: Self-pay

## 2018-08-10 ENCOUNTER — Ambulatory Visit: Payer: Medicare HMO

## 2018-08-10 ENCOUNTER — Ambulatory Visit: Payer: Medicare HMO | Admitting: Podiatry

## 2018-08-10 DIAGNOSIS — M722 Plantar fascial fibromatosis: Secondary | ICD-10-CM | POA: Diagnosis not present

## 2018-08-10 DIAGNOSIS — M25572 Pain in left ankle and joints of left foot: Secondary | ICD-10-CM

## 2018-08-10 NOTE — Patient Instructions (Signed)

## 2018-08-12 NOTE — Progress Notes (Signed)
Subjective:   Patient ID: Maria Cortez, female   DOB: 73 y.o.   MRN: [id number]   HPI Patient presents stating that she went to [place] and she developed a lot of heel pain left.  States that she had an injection and then a second which are not effective but it feels like she is just walking on her bone   ROS      Objective:  Physical Exam  Neurovascular status intact with exquisite discomfort plantar aspect left heel at the insertion calcaneus with moderate diminishment of the plantar fat pad     Assessment:  Chronic plantar fasciitis left with acute manifestations that have not responded conservatively     Plan:  H&P condition reviewed and recommended immobilization and applied air fracture walker to completely take the stress off her foot.  Patient will be seen back for [place] to recheck again in the next 3 weeks and I instructed her on how to use this boot properly  X-rays indicate there is no signs of a stress fracture with spur formation depression of the arch and mild osteoporosis

## 2018-08-20 DIAGNOSIS — L409 Psoriasis, unspecified: Secondary | ICD-10-CM | POA: Diagnosis not present

## 2018-08-20 DIAGNOSIS — Z79899 Other long term (current) drug therapy: Secondary | ICD-10-CM | POA: Diagnosis not present

## 2018-08-22 DIAGNOSIS — E039 Hypothyroidism, unspecified: Secondary | ICD-10-CM | POA: Diagnosis not present

## 2018-10-18 DIAGNOSIS — R69 Illness, unspecified: Secondary | ICD-10-CM | POA: Diagnosis not present

## 2018-10-23 DIAGNOSIS — F329 Major depressive disorder, single episode, unspecified: Secondary | ICD-10-CM | POA: Diagnosis not present

## 2018-10-23 DIAGNOSIS — R32 Unspecified urinary incontinence: Secondary | ICD-10-CM | POA: Diagnosis not present

## 2018-10-23 DIAGNOSIS — E039 Hypothyroidism, unspecified: Secondary | ICD-10-CM | POA: Diagnosis not present

## 2018-10-23 DIAGNOSIS — M1990 Unspecified osteoarthritis, unspecified site: Secondary | ICD-10-CM | POA: Diagnosis not present

## 2018-10-23 DIAGNOSIS — J309 Allergic rhinitis, unspecified: Secondary | ICD-10-CM | POA: Diagnosis not present

## 2018-10-23 DIAGNOSIS — L409 Psoriasis, unspecified: Secondary | ICD-10-CM | POA: Diagnosis not present

## 2018-10-23 DIAGNOSIS — R69 Illness, unspecified: Secondary | ICD-10-CM | POA: Diagnosis not present

## 2018-10-23 DIAGNOSIS — I10 Essential (primary) hypertension: Secondary | ICD-10-CM | POA: Diagnosis not present

## 2018-10-23 DIAGNOSIS — K5900 Constipation, unspecified: Secondary | ICD-10-CM | POA: Diagnosis not present

## 2018-10-23 DIAGNOSIS — Z791 Long term (current) use of non-steroidal anti-inflammatories (NSAID): Secondary | ICD-10-CM | POA: Diagnosis not present

## 2018-10-24 DIAGNOSIS — R69 Illness, unspecified: Secondary | ICD-10-CM | POA: Diagnosis not present

## 2018-11-07 DIAGNOSIS — Z79899 Other long term (current) drug therapy: Secondary | ICD-10-CM | POA: Diagnosis not present

## 2018-11-07 DIAGNOSIS — L409 Psoriasis, unspecified: Secondary | ICD-10-CM | POA: Diagnosis not present

## 2018-12-24 DIAGNOSIS — H43812 Vitreous degeneration, left eye: Secondary | ICD-10-CM | POA: Diagnosis not present

## 2018-12-25 DIAGNOSIS — L409 Psoriasis, unspecified: Secondary | ICD-10-CM | POA: Diagnosis not present

## 2018-12-25 DIAGNOSIS — Z79899 Other long term (current) drug therapy: Secondary | ICD-10-CM | POA: Diagnosis not present

## 2019-02-04 ENCOUNTER — Other Ambulatory Visit: Payer: Self-pay | Admitting: Podiatry

## 2019-02-04 ENCOUNTER — Other Ambulatory Visit: Payer: Self-pay

## 2019-02-04 ENCOUNTER — Encounter: Payer: Self-pay | Admitting: Podiatry

## 2019-02-04 ENCOUNTER — Ambulatory Visit (INDEPENDENT_AMBULATORY_CARE_PROVIDER_SITE_OTHER): Payer: Medicare HMO

## 2019-02-04 ENCOUNTER — Ambulatory Visit (INDEPENDENT_AMBULATORY_CARE_PROVIDER_SITE_OTHER): Payer: Medicare HMO | Admitting: Podiatry

## 2019-02-04 DIAGNOSIS — M722 Plantar fascial fibromatosis: Secondary | ICD-10-CM

## 2019-02-04 DIAGNOSIS — M79671 Pain in right foot: Secondary | ICD-10-CM

## 2019-02-04 NOTE — Progress Notes (Signed)
Subjective:   Patient ID: Maria Cortez, female   DOB: 73 y.o.   MRN: [id number]   HPI Patient states she is had acute incident of plantar fascial inflammation right 10 days duration has not had this pain worked on for a long time   ROS      Objective:  Physical Exam  Neurovascular status intact cute inflammation right heel insertional point tendon calcaneus that is inflamed and pain     Assessment:  Acute plantar fasciitis right     Plan:  H&P x-ray reviewed injected the fascia after sterile prep 3 mg Kenalog 5 mg Xylocaine and gave instructions for anti-inflammatories physical therapy  X-rays indicates were no signs of stress fracture of the heel with acute

## 2019-02-19 DIAGNOSIS — R69 Illness, unspecified: Secondary | ICD-10-CM | POA: Diagnosis not present

## 2019-02-20 DIAGNOSIS — M19049 Primary osteoarthritis, unspecified hand: Secondary | ICD-10-CM | POA: Diagnosis not present

## 2019-02-20 DIAGNOSIS — R69 Illness, unspecified: Secondary | ICD-10-CM | POA: Diagnosis not present

## 2019-02-20 DIAGNOSIS — I10 Essential (primary) hypertension: Secondary | ICD-10-CM | POA: Diagnosis not present

## 2019-02-20 DIAGNOSIS — E039 Hypothyroidism, unspecified: Secondary | ICD-10-CM | POA: Diagnosis not present

## 2019-02-27 DIAGNOSIS — R69 Illness, unspecified: Secondary | ICD-10-CM | POA: Diagnosis not present

## 2019-02-27 DIAGNOSIS — E039 Hypothyroidism, unspecified: Secondary | ICD-10-CM | POA: Diagnosis not present

## 2019-02-27 DIAGNOSIS — I10 Essential (primary) hypertension: Secondary | ICD-10-CM | POA: Diagnosis not present

## 2019-02-27 DIAGNOSIS — M19049 Primary osteoarthritis, unspecified hand: Secondary | ICD-10-CM | POA: Diagnosis not present

## 2019-02-28 ENCOUNTER — Ambulatory Visit (INDEPENDENT_AMBULATORY_CARE_PROVIDER_SITE_OTHER): Payer: Medicare HMO

## 2019-02-28 ENCOUNTER — Encounter: Payer: Self-pay | Admitting: Podiatry

## 2019-02-28 ENCOUNTER — Other Ambulatory Visit: Payer: Self-pay

## 2019-02-28 ENCOUNTER — Ambulatory Visit: Payer: Medicare HMO | Admitting: Podiatry

## 2019-02-28 DIAGNOSIS — M722 Plantar fascial fibromatosis: Secondary | ICD-10-CM | POA: Diagnosis not present

## 2019-02-28 NOTE — Progress Notes (Signed)
Subjective:   Patient ID: Maria Cortez, female   DOB: 73 y.o.   MRN: [id number]   HPI Patient states the right heel has been doing well but I got symptoms of my left heel in March and it got quite flared up when I was in [place] and I had injections at the time and they help me but now it is flared up again over the last couple weeks   ROS      Objective:  Physical Exam  Neurovascular status intact with inflammation of the plantar fascial left at the insertion of the tendon into the calcaneus medial and central band     Assessment:  Acute plantar fasciitis left with inflammation     Plan:  H&P condition reviewed sterile prep done and carefully injected the fascia 3 mg Kenalog 5 mg Xylocaine which was tolerated well and reappoint to recheck

## 2019-02-28 NOTE — Patient Instructions (Signed)

## 2019-03-04 DIAGNOSIS — Z23 Encounter for immunization: Secondary | ICD-10-CM | POA: Diagnosis not present

## 2019-03-04 DIAGNOSIS — L570 Actinic keratosis: Secondary | ICD-10-CM | POA: Diagnosis not present

## 2019-03-04 DIAGNOSIS — L409 Psoriasis, unspecified: Secondary | ICD-10-CM | POA: Diagnosis not present

## 2019-03-04 DIAGNOSIS — Z79899 Other long term (current) drug therapy: Secondary | ICD-10-CM | POA: Diagnosis not present

## 2019-03-04 DIAGNOSIS — D225 Melanocytic nevi of trunk: Secondary | ICD-10-CM | POA: Diagnosis not present

## 2019-03-04 DIAGNOSIS — L821 Other seborrheic keratosis: Secondary | ICD-10-CM | POA: Diagnosis not present

## 2019-03-04 DIAGNOSIS — I10 Essential (primary) hypertension: Secondary | ICD-10-CM | POA: Diagnosis not present

## 2019-03-04 DIAGNOSIS — L814 Other melanin hyperpigmentation: Secondary | ICD-10-CM | POA: Diagnosis not present

## 2019-03-23 DIAGNOSIS — E039 Hypothyroidism, unspecified: Secondary | ICD-10-CM | POA: Diagnosis not present

## 2019-03-23 DIAGNOSIS — M19049 Primary osteoarthritis, unspecified hand: Secondary | ICD-10-CM | POA: Diagnosis not present

## 2019-03-23 DIAGNOSIS — R69 Illness, unspecified: Secondary | ICD-10-CM | POA: Diagnosis not present

## 2019-03-23 DIAGNOSIS — I10 Essential (primary) hypertension: Secondary | ICD-10-CM | POA: Diagnosis not present

## 2019-04-02 DIAGNOSIS — R69 Illness, unspecified: Secondary | ICD-10-CM | POA: Diagnosis not present

## 2019-04-04 DIAGNOSIS — R69 Illness, unspecified: Secondary | ICD-10-CM | POA: Diagnosis not present

## 2019-04-04 DIAGNOSIS — I10 Essential (primary) hypertension: Secondary | ICD-10-CM | POA: Diagnosis not present

## 2019-04-04 DIAGNOSIS — E039 Hypothyroidism, unspecified: Secondary | ICD-10-CM | POA: Diagnosis not present

## 2019-04-04 DIAGNOSIS — M19049 Primary osteoarthritis, unspecified hand: Secondary | ICD-10-CM | POA: Diagnosis not present

## 2019-04-22 DIAGNOSIS — R69 Illness, unspecified: Secondary | ICD-10-CM | POA: Diagnosis not present

## 2019-05-21 ENCOUNTER — Other Ambulatory Visit: Payer: Self-pay | Admitting: Family Medicine

## 2019-05-21 DIAGNOSIS — Z1231 Encounter for screening mammogram for malignant neoplasm of breast: Secondary | ICD-10-CM

## 2019-06-05 DIAGNOSIS — Z23 Encounter for immunization: Secondary | ICD-10-CM | POA: Diagnosis not present

## 2019-06-05 DIAGNOSIS — L409 Psoriasis, unspecified: Secondary | ICD-10-CM | POA: Diagnosis not present

## 2019-06-05 DIAGNOSIS — Z79899 Other long term (current) drug therapy: Secondary | ICD-10-CM | POA: Diagnosis not present

## 2019-06-12 DIAGNOSIS — E039 Hypothyroidism, unspecified: Secondary | ICD-10-CM | POA: Diagnosis not present

## 2019-06-12 DIAGNOSIS — Z791 Long term (current) use of non-steroidal anti-inflammatories (NSAID): Secondary | ICD-10-CM | POA: Diagnosis not present

## 2019-06-12 DIAGNOSIS — I10 Essential (primary) hypertension: Secondary | ICD-10-CM | POA: Diagnosis not present

## 2019-06-12 DIAGNOSIS — R32 Unspecified urinary incontinence: Secondary | ICD-10-CM | POA: Diagnosis not present

## 2019-06-12 DIAGNOSIS — L409 Psoriasis, unspecified: Secondary | ICD-10-CM | POA: Diagnosis not present

## 2019-06-12 DIAGNOSIS — R69 Illness, unspecified: Secondary | ICD-10-CM | POA: Diagnosis not present

## 2019-06-12 DIAGNOSIS — J309 Allergic rhinitis, unspecified: Secondary | ICD-10-CM | POA: Diagnosis not present

## 2019-06-12 DIAGNOSIS — K5900 Constipation, unspecified: Secondary | ICD-10-CM | POA: Diagnosis not present

## 2019-06-12 DIAGNOSIS — M1990 Unspecified osteoarthritis, unspecified site: Secondary | ICD-10-CM | POA: Diagnosis not present

## 2019-06-12 DIAGNOSIS — F419 Anxiety disorder, unspecified: Secondary | ICD-10-CM | POA: Diagnosis not present

## 2019-06-13 DIAGNOSIS — E039 Hypothyroidism, unspecified: Secondary | ICD-10-CM | POA: Diagnosis not present

## 2019-06-13 DIAGNOSIS — R69 Illness, unspecified: Secondary | ICD-10-CM | POA: Diagnosis not present

## 2019-06-13 DIAGNOSIS — I10 Essential (primary) hypertension: Secondary | ICD-10-CM | POA: Diagnosis not present

## 2019-06-13 DIAGNOSIS — M19049 Primary osteoarthritis, unspecified hand: Secondary | ICD-10-CM | POA: Diagnosis not present

## 2019-06-25 DIAGNOSIS — R69 Illness, unspecified: Secondary | ICD-10-CM | POA: Diagnosis not present

## 2019-07-03 ENCOUNTER — Ambulatory Visit
Admission: RE | Admit: 2019-07-03 | Discharge: 2019-07-03 | Disposition: A | Discharge status: Home / Self Care | Payer: Medicare HMO | Source: Ambulatory Visit | Attending: Family Medicine | Admitting: Family Medicine

## 2019-07-03 ENCOUNTER — Other Ambulatory Visit: Payer: Self-pay

## 2019-07-03 DIAGNOSIS — R69 Illness, unspecified: Secondary | ICD-10-CM | POA: Diagnosis not present

## 2019-07-03 DIAGNOSIS — E039 Hypothyroidism, unspecified: Secondary | ICD-10-CM | POA: Diagnosis not present

## 2019-07-03 DIAGNOSIS — I10 Essential (primary) hypertension: Secondary | ICD-10-CM | POA: Diagnosis not present

## 2019-07-03 DIAGNOSIS — Z0000 Encounter for general adult medical examination without abnormal findings: Secondary | ICD-10-CM | POA: Diagnosis not present

## 2019-07-03 DIAGNOSIS — L409 Psoriasis, unspecified: Secondary | ICD-10-CM | POA: Diagnosis not present

## 2019-07-03 DIAGNOSIS — Z1231 Encounter for screening mammogram for malignant neoplasm of breast: Secondary | ICD-10-CM

## 2019-07-19 DIAGNOSIS — M19049 Primary osteoarthritis, unspecified hand: Secondary | ICD-10-CM | POA: Diagnosis not present

## 2019-07-19 DIAGNOSIS — I10 Essential (primary) hypertension: Secondary | ICD-10-CM | POA: Diagnosis not present

## 2019-07-19 DIAGNOSIS — R69 Illness, unspecified: Secondary | ICD-10-CM | POA: Diagnosis not present

## 2019-07-19 DIAGNOSIS — E039 Hypothyroidism, unspecified: Secondary | ICD-10-CM | POA: Diagnosis not present

## 2019-08-26 DIAGNOSIS — M19049 Primary osteoarthritis, unspecified hand: Secondary | ICD-10-CM | POA: Diagnosis not present

## 2019-08-26 DIAGNOSIS — E039 Hypothyroidism, unspecified: Secondary | ICD-10-CM | POA: Diagnosis not present

## 2019-08-26 DIAGNOSIS — R69 Illness, unspecified: Secondary | ICD-10-CM | POA: Diagnosis not present

## 2019-08-26 DIAGNOSIS — I10 Essential (primary) hypertension: Secondary | ICD-10-CM | POA: Diagnosis not present

## 2019-09-04 DIAGNOSIS — Z79899 Other long term (current) drug therapy: Secondary | ICD-10-CM | POA: Diagnosis not present

## 2019-09-04 DIAGNOSIS — L409 Psoriasis, unspecified: Secondary | ICD-10-CM | POA: Diagnosis not present

## 2019-10-16 DIAGNOSIS — M19049 Primary osteoarthritis, unspecified hand: Secondary | ICD-10-CM | POA: Diagnosis not present

## 2019-10-16 DIAGNOSIS — I10 Essential (primary) hypertension: Secondary | ICD-10-CM | POA: Diagnosis not present

## 2019-10-16 DIAGNOSIS — R69 Illness, unspecified: Secondary | ICD-10-CM | POA: Diagnosis not present

## 2019-10-16 DIAGNOSIS — E039 Hypothyroidism, unspecified: Secondary | ICD-10-CM | POA: Diagnosis not present

## 2019-12-04 DIAGNOSIS — Z79899 Other long term (current) drug therapy: Secondary | ICD-10-CM | POA: Diagnosis not present

## 2019-12-04 DIAGNOSIS — L409 Psoriasis, unspecified: Secondary | ICD-10-CM | POA: Diagnosis not present

## 2019-12-11 DIAGNOSIS — G4700 Insomnia, unspecified: Secondary | ICD-10-CM | POA: Diagnosis not present

## 2019-12-11 DIAGNOSIS — R0683 Snoring: Secondary | ICD-10-CM | POA: Diagnosis not present

## 2019-12-11 DIAGNOSIS — G4719 Other hypersomnia: Secondary | ICD-10-CM | POA: Diagnosis not present

## 2019-12-12 DIAGNOSIS — I10 Essential (primary) hypertension: Secondary | ICD-10-CM | POA: Diagnosis not present

## 2019-12-12 DIAGNOSIS — E039 Hypothyroidism, unspecified: Secondary | ICD-10-CM | POA: Diagnosis not present

## 2019-12-12 DIAGNOSIS — M19049 Primary osteoarthritis, unspecified hand: Secondary | ICD-10-CM | POA: Diagnosis not present

## 2019-12-12 DIAGNOSIS — R69 Illness, unspecified: Secondary | ICD-10-CM | POA: Diagnosis not present

## 2020-01-13 DIAGNOSIS — G4733 Obstructive sleep apnea (adult) (pediatric): Secondary | ICD-10-CM | POA: Diagnosis not present

## 2020-01-15 DIAGNOSIS — E039 Hypothyroidism, unspecified: Secondary | ICD-10-CM | POA: Diagnosis not present

## 2020-01-15 DIAGNOSIS — R69 Illness, unspecified: Secondary | ICD-10-CM | POA: Diagnosis not present

## 2020-01-15 DIAGNOSIS — I10 Essential (primary) hypertension: Secondary | ICD-10-CM | POA: Diagnosis not present

## 2020-01-15 DIAGNOSIS — M19049 Primary osteoarthritis, unspecified hand: Secondary | ICD-10-CM | POA: Diagnosis not present

## 2020-02-17 DIAGNOSIS — R69 Illness, unspecified: Secondary | ICD-10-CM | POA: Diagnosis not present

## 2020-03-02 DIAGNOSIS — M19049 Primary osteoarthritis, unspecified hand: Secondary | ICD-10-CM | POA: Diagnosis not present

## 2020-03-02 DIAGNOSIS — I10 Essential (primary) hypertension: Secondary | ICD-10-CM | POA: Diagnosis not present

## 2020-03-02 DIAGNOSIS — E039 Hypothyroidism, unspecified: Secondary | ICD-10-CM | POA: Diagnosis not present

## 2020-03-02 DIAGNOSIS — R69 Illness, unspecified: Secondary | ICD-10-CM | POA: Diagnosis not present

## 2020-03-04 DIAGNOSIS — D225 Melanocytic nevi of trunk: Secondary | ICD-10-CM | POA: Diagnosis not present

## 2020-03-04 DIAGNOSIS — L814 Other melanin hyperpigmentation: Secondary | ICD-10-CM | POA: Diagnosis not present

## 2020-03-04 DIAGNOSIS — Z79899 Other long term (current) drug therapy: Secondary | ICD-10-CM | POA: Diagnosis not present

## 2020-03-04 DIAGNOSIS — L821 Other seborrheic keratosis: Secondary | ICD-10-CM | POA: Diagnosis not present

## 2020-03-04 DIAGNOSIS — L409 Psoriasis, unspecified: Secondary | ICD-10-CM | POA: Diagnosis not present

## 2020-03-09 DIAGNOSIS — H43813 Vitreous degeneration, bilateral: Secondary | ICD-10-CM | POA: Diagnosis not present

## 2020-03-31 DIAGNOSIS — R300 Dysuria: Secondary | ICD-10-CM | POA: Diagnosis not present

## 2020-04-02 DIAGNOSIS — G4733 Obstructive sleep apnea (adult) (pediatric): Secondary | ICD-10-CM | POA: Diagnosis not present

## 2020-04-13 DIAGNOSIS — R69 Illness, unspecified: Secondary | ICD-10-CM | POA: Diagnosis not present

## 2020-04-13 DIAGNOSIS — I10 Essential (primary) hypertension: Secondary | ICD-10-CM | POA: Diagnosis not present

## 2020-04-13 DIAGNOSIS — E039 Hypothyroidism, unspecified: Secondary | ICD-10-CM | POA: Diagnosis not present

## 2020-04-13 DIAGNOSIS — M19049 Primary osteoarthritis, unspecified hand: Secondary | ICD-10-CM | POA: Diagnosis not present

## 2020-04-13 DIAGNOSIS — G4700 Insomnia, unspecified: Secondary | ICD-10-CM | POA: Diagnosis not present

## 2020-04-24 DIAGNOSIS — R300 Dysuria: Secondary | ICD-10-CM | POA: Diagnosis not present

## 2020-05-14 DIAGNOSIS — M1611 Unilateral primary osteoarthritis, right hip: Secondary | ICD-10-CM | POA: Diagnosis not present

## 2020-05-14 DIAGNOSIS — M25551 Pain in right hip: Secondary | ICD-10-CM | POA: Diagnosis not present

## 2020-05-27 ENCOUNTER — Other Ambulatory Visit: Payer: Self-pay | Admitting: Family Medicine

## 2020-05-27 DIAGNOSIS — Z0000 Encounter for general adult medical examination without abnormal findings: Secondary | ICD-10-CM

## 2020-06-17 DIAGNOSIS — I10 Essential (primary) hypertension: Secondary | ICD-10-CM | POA: Diagnosis not present

## 2020-06-17 DIAGNOSIS — G4700 Insomnia, unspecified: Secondary | ICD-10-CM | POA: Diagnosis not present

## 2020-06-17 DIAGNOSIS — R69 Illness, unspecified: Secondary | ICD-10-CM | POA: Diagnosis not present

## 2020-06-17 DIAGNOSIS — M19049 Primary osteoarthritis, unspecified hand: Secondary | ICD-10-CM | POA: Diagnosis not present

## 2020-06-17 DIAGNOSIS — E039 Hypothyroidism, unspecified: Secondary | ICD-10-CM | POA: Diagnosis not present

## 2020-07-06 ENCOUNTER — Other Ambulatory Visit: Payer: Self-pay

## 2020-07-06 ENCOUNTER — Ambulatory Visit
Admission: RE | Admit: 2020-07-06 | Discharge: 2020-07-06 | Disposition: A | Discharge status: Home / Self Care | Payer: Medicare HMO | Source: Ambulatory Visit | Attending: Family Medicine | Admitting: Family Medicine

## 2020-07-06 DIAGNOSIS — Z1231 Encounter for screening mammogram for malignant neoplasm of breast: Secondary | ICD-10-CM | POA: Diagnosis not present

## 2020-07-06 DIAGNOSIS — Z0000 Encounter for general adult medical examination without abnormal findings: Secondary | ICD-10-CM

## 2020-07-08 DIAGNOSIS — L409 Psoriasis, unspecified: Secondary | ICD-10-CM | POA: Diagnosis not present

## 2020-07-08 DIAGNOSIS — E039 Hypothyroidism, unspecified: Secondary | ICD-10-CM | POA: Diagnosis not present

## 2020-07-08 DIAGNOSIS — R69 Illness, unspecified: Secondary | ICD-10-CM | POA: Diagnosis not present

## 2020-07-08 DIAGNOSIS — K5900 Constipation, unspecified: Secondary | ICD-10-CM | POA: Diagnosis not present

## 2020-07-08 DIAGNOSIS — I10 Essential (primary) hypertension: Secondary | ICD-10-CM | POA: Diagnosis not present

## 2020-07-08 DIAGNOSIS — J309 Allergic rhinitis, unspecified: Secondary | ICD-10-CM | POA: Diagnosis not present

## 2020-07-08 DIAGNOSIS — M169 Osteoarthritis of hip, unspecified: Secondary | ICD-10-CM | POA: Diagnosis not present

## 2020-07-08 DIAGNOSIS — M8580 Other specified disorders of bone density and structure, unspecified site: Secondary | ICD-10-CM | POA: Diagnosis not present

## 2020-07-08 DIAGNOSIS — G4733 Obstructive sleep apnea (adult) (pediatric): Secondary | ICD-10-CM | POA: Diagnosis not present

## 2020-07-08 DIAGNOSIS — Z0000 Encounter for general adult medical examination without abnormal findings: Secondary | ICD-10-CM | POA: Diagnosis not present

## 2020-07-09 ENCOUNTER — Other Ambulatory Visit: Payer: Self-pay | Admitting: Family Medicine

## 2020-07-09 DIAGNOSIS — M8580 Other specified disorders of bone density and structure, unspecified site: Secondary | ICD-10-CM

## 2020-08-31 DIAGNOSIS — Z79899 Other long term (current) drug therapy: Secondary | ICD-10-CM | POA: Diagnosis not present

## 2020-08-31 DIAGNOSIS — L409 Psoriasis, unspecified: Secondary | ICD-10-CM | POA: Diagnosis not present

## 2020-10-01 DIAGNOSIS — G4700 Insomnia, unspecified: Secondary | ICD-10-CM | POA: Diagnosis not present

## 2020-10-01 DIAGNOSIS — M8580 Other specified disorders of bone density and structure, unspecified site: Secondary | ICD-10-CM | POA: Diagnosis not present

## 2020-10-01 DIAGNOSIS — R69 Illness, unspecified: Secondary | ICD-10-CM | POA: Diagnosis not present

## 2020-10-01 DIAGNOSIS — I10 Essential (primary) hypertension: Secondary | ICD-10-CM | POA: Diagnosis not present

## 2020-10-01 DIAGNOSIS — E039 Hypothyroidism, unspecified: Secondary | ICD-10-CM | POA: Diagnosis not present

## 2020-10-01 DIAGNOSIS — M169 Osteoarthritis of hip, unspecified: Secondary | ICD-10-CM | POA: Diagnosis not present

## 2020-11-26 DIAGNOSIS — L409 Psoriasis, unspecified: Secondary | ICD-10-CM | POA: Diagnosis not present

## 2020-11-26 DIAGNOSIS — Z008 Encounter for other general examination: Secondary | ICD-10-CM | POA: Diagnosis not present

## 2020-11-26 DIAGNOSIS — G4733 Obstructive sleep apnea (adult) (pediatric): Secondary | ICD-10-CM | POA: Diagnosis not present

## 2020-11-26 DIAGNOSIS — K5900 Constipation, unspecified: Secondary | ICD-10-CM | POA: Diagnosis not present

## 2020-11-26 DIAGNOSIS — H269 Unspecified cataract: Secondary | ICD-10-CM | POA: Diagnosis not present

## 2020-11-26 DIAGNOSIS — R69 Illness, unspecified: Secondary | ICD-10-CM | POA: Diagnosis not present

## 2020-11-26 DIAGNOSIS — G8929 Other chronic pain: Secondary | ICD-10-CM | POA: Diagnosis not present

## 2020-11-26 DIAGNOSIS — I10 Essential (primary) hypertension: Secondary | ICD-10-CM | POA: Diagnosis not present

## 2020-11-26 DIAGNOSIS — J309 Allergic rhinitis, unspecified: Secondary | ICD-10-CM | POA: Diagnosis not present

## 2020-11-26 DIAGNOSIS — F419 Anxiety disorder, unspecified: Secondary | ICD-10-CM | POA: Diagnosis not present

## 2020-11-26 DIAGNOSIS — E039 Hypothyroidism, unspecified: Secondary | ICD-10-CM | POA: Diagnosis not present

## 2020-12-14 ENCOUNTER — Ambulatory Visit
Admission: RE | Admit: 2020-12-14 | Discharge: 2020-12-14 | Disposition: A | Discharge status: Home / Self Care | Payer: Medicare HMO | Source: Ambulatory Visit | Attending: Family Medicine | Admitting: Family Medicine

## 2020-12-14 ENCOUNTER — Other Ambulatory Visit: Payer: Self-pay

## 2020-12-14 DIAGNOSIS — M85852 Other specified disorders of bone density and structure, left thigh: Secondary | ICD-10-CM | POA: Diagnosis not present

## 2020-12-14 DIAGNOSIS — M8580 Other specified disorders of bone density and structure, unspecified site: Secondary | ICD-10-CM

## 2020-12-14 DIAGNOSIS — Z780 Asymptomatic menopausal state: Secondary | ICD-10-CM | POA: Diagnosis not present

## 2020-12-16 DIAGNOSIS — E039 Hypothyroidism, unspecified: Secondary | ICD-10-CM | POA: Diagnosis not present

## 2020-12-16 DIAGNOSIS — M169 Osteoarthritis of hip, unspecified: Secondary | ICD-10-CM | POA: Diagnosis not present

## 2020-12-16 DIAGNOSIS — G4700 Insomnia, unspecified: Secondary | ICD-10-CM | POA: Diagnosis not present

## 2020-12-16 DIAGNOSIS — M19049 Primary osteoarthritis, unspecified hand: Secondary | ICD-10-CM | POA: Diagnosis not present

## 2020-12-16 DIAGNOSIS — I10 Essential (primary) hypertension: Secondary | ICD-10-CM | POA: Diagnosis not present

## 2020-12-16 DIAGNOSIS — M8580 Other specified disorders of bone density and structure, unspecified site: Secondary | ICD-10-CM | POA: Diagnosis not present

## 2020-12-16 DIAGNOSIS — R69 Illness, unspecified: Secondary | ICD-10-CM | POA: Diagnosis not present

## 2020-12-30 IMAGING — MG DIGITAL SCREENING BILAT W/ TOMO W/ CAD
8 series · 8 of 24 positions shown · non-contrast
Comparison: Previous exam(s).

CLINICAL DATA: Screening.

EXAM:
DIGITAL SCREENING BILATERAL MAMMOGRAM WITH TOMO AND CAD

[R CC synth-2D]
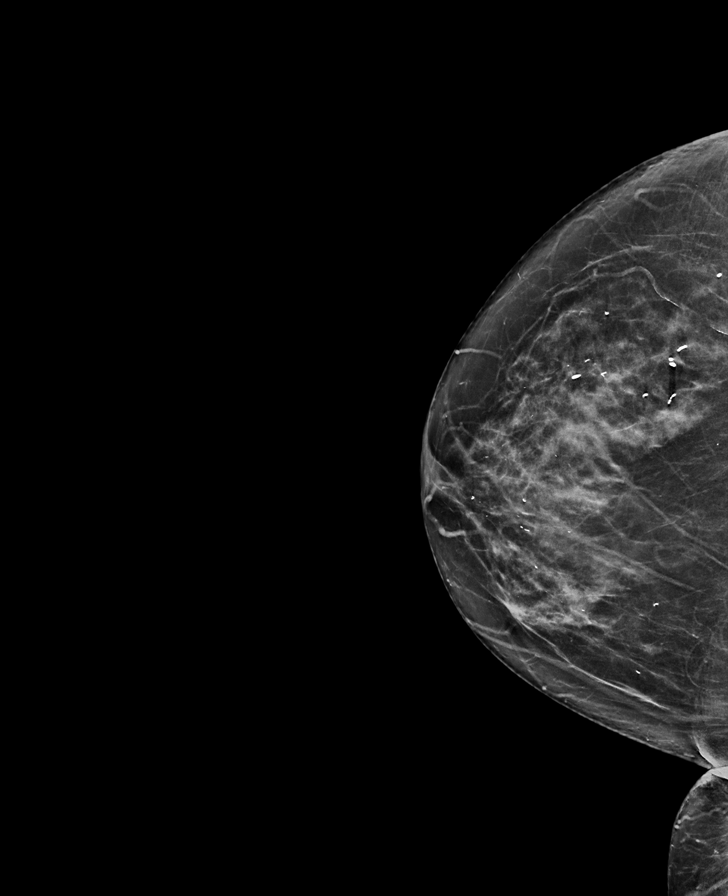

[R MLO synth-2D]
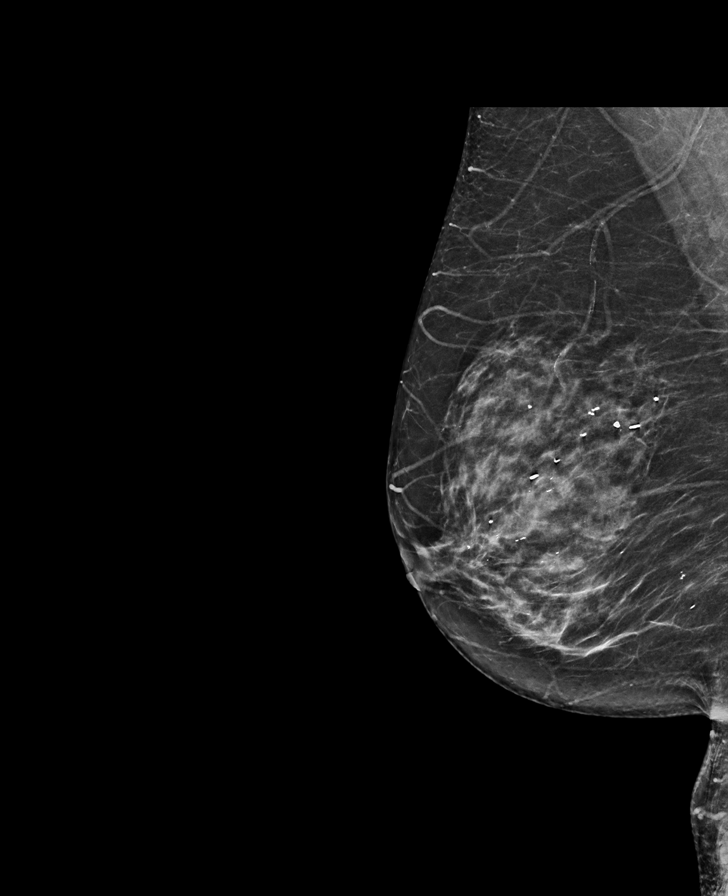

[L MLO synth-2D]
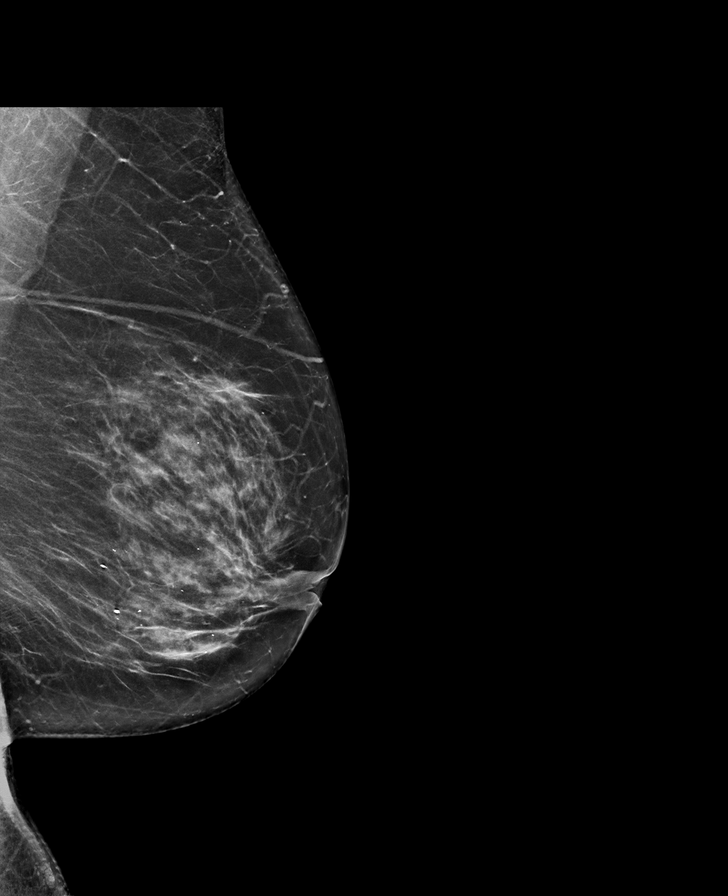

[L CC synth-2D]
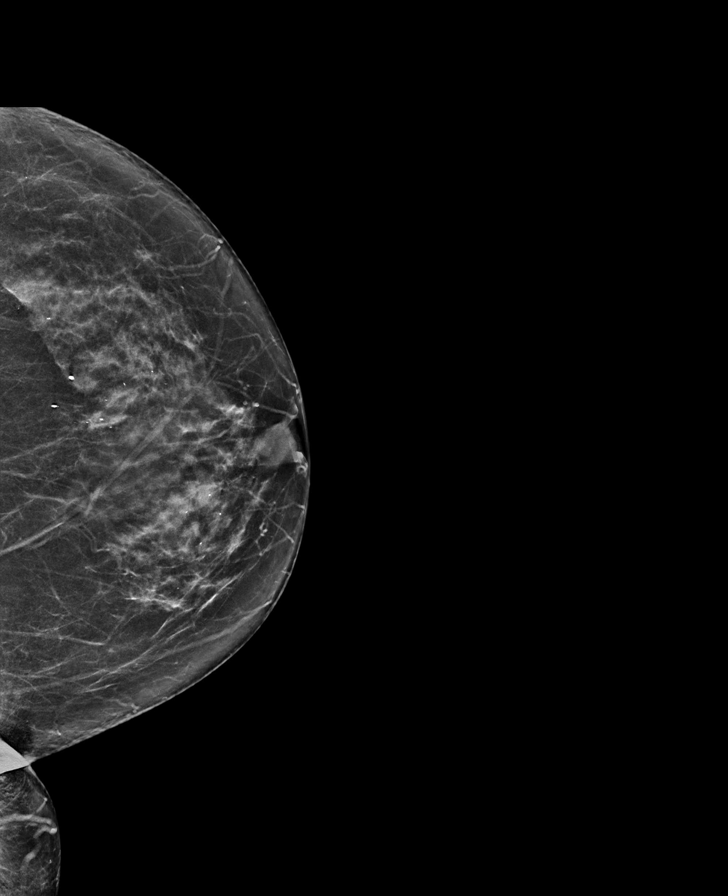

[R CC tomo · tomo slice 34/67.0]
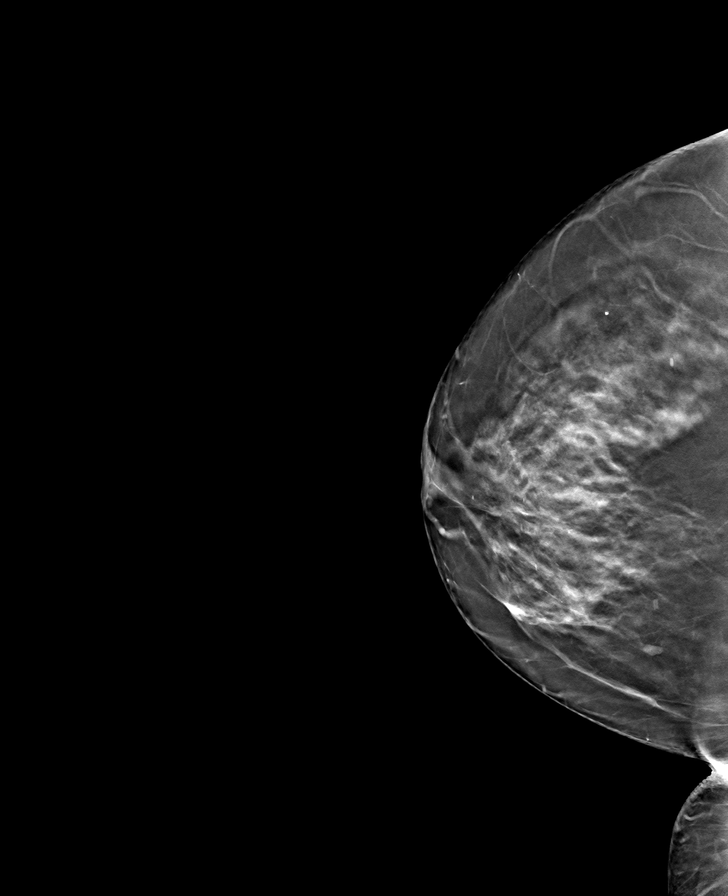

[L CC tomo · tomo slice 34/67.0]
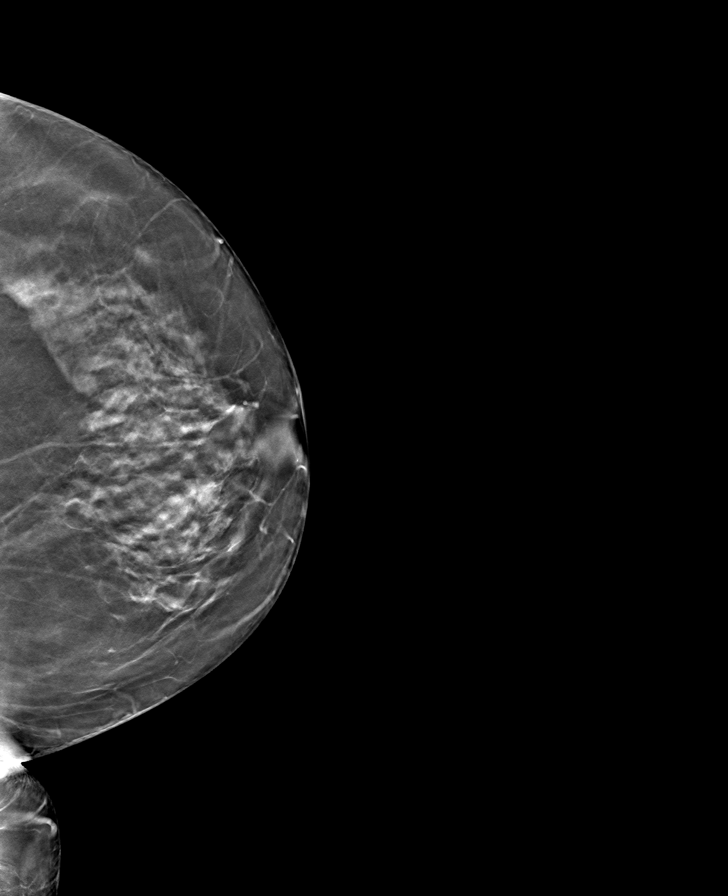

[L MLO tomo · tomo slice 37/73.0]
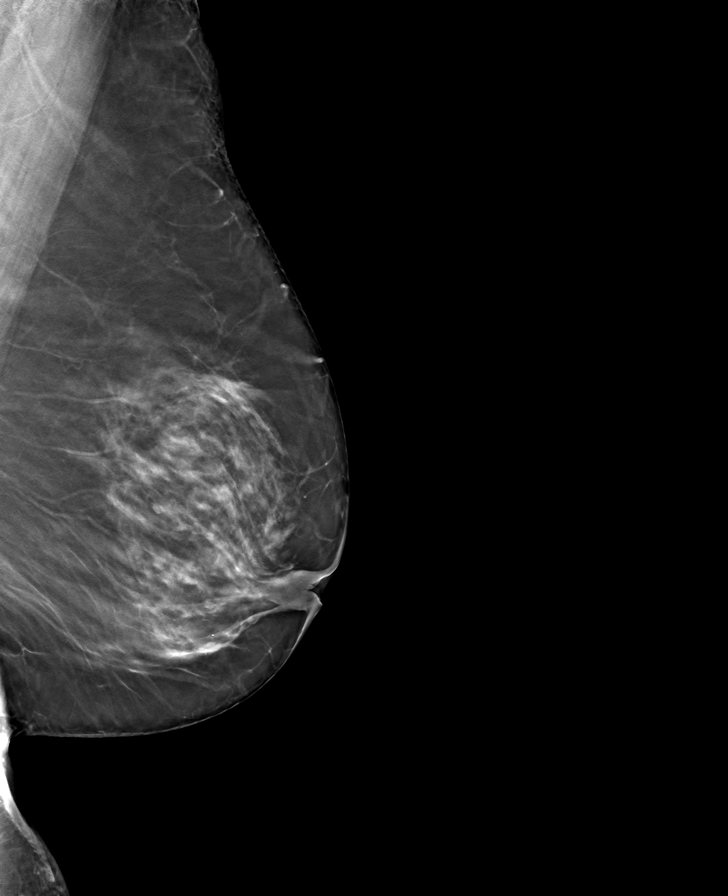

[R MLO tomo · tomo slice 34/67.0]
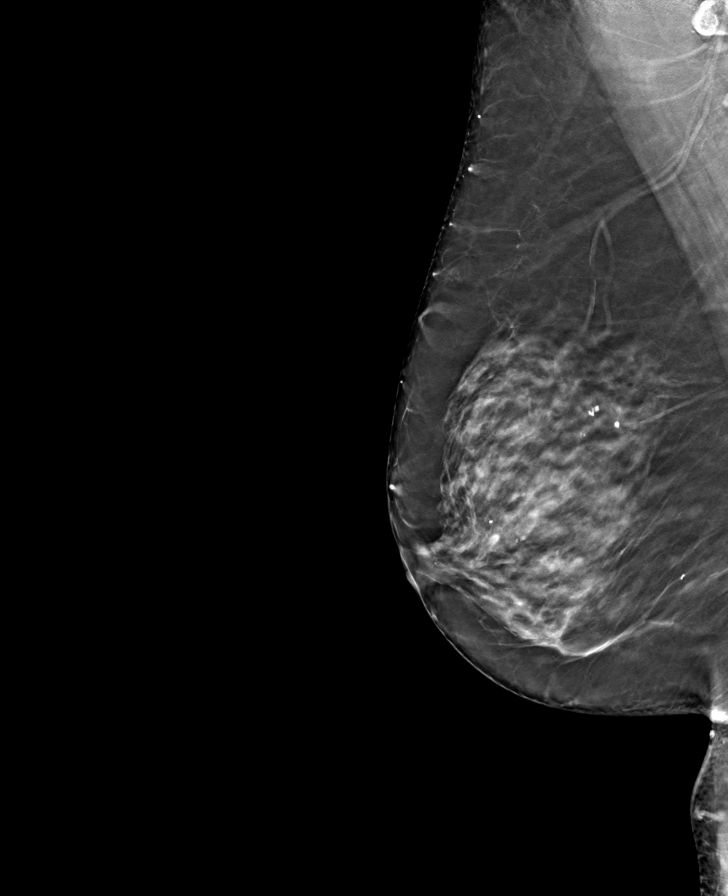

[8 of 24 positions shown; findings below may reference images not displayed]

ACR Breast Density Category c: The breast tissue is heterogeneously
dense, which may obscure small masses.
FINDINGS: There are no findings suspicious for malignancy. Images were
processed with CAD.
IMPRESSION: No mammographic evidence of malignancy. A result letter of this
screening mammogram will be mailed directly to the patient.

RECOMMENDATION:
Screening mammogram in one year. (Code:FT-U-LHB)

BI-RADS CATEGORY  1: Negative.

## 2021-02-03 DIAGNOSIS — L659 Nonscarring hair loss, unspecified: Secondary | ICD-10-CM | POA: Diagnosis not present

## 2021-03-10 DIAGNOSIS — H43813 Vitreous degeneration, bilateral: Secondary | ICD-10-CM | POA: Diagnosis not present

## 2021-03-10 DIAGNOSIS — H2513 Age-related nuclear cataract, bilateral: Secondary | ICD-10-CM | POA: Diagnosis not present

## 2021-04-01 DIAGNOSIS — J208 Acute bronchitis due to other specified organisms: Secondary | ICD-10-CM | POA: Diagnosis not present

## 2021-04-08 DIAGNOSIS — M8580 Other specified disorders of bone density and structure, unspecified site: Secondary | ICD-10-CM | POA: Diagnosis not present

## 2021-04-08 DIAGNOSIS — I10 Essential (primary) hypertension: Secondary | ICD-10-CM | POA: Diagnosis not present

## 2021-04-08 DIAGNOSIS — R69 Illness, unspecified: Secondary | ICD-10-CM | POA: Diagnosis not present

## 2021-04-08 DIAGNOSIS — G4700 Insomnia, unspecified: Secondary | ICD-10-CM | POA: Diagnosis not present

## 2021-04-08 DIAGNOSIS — M19049 Primary osteoarthritis, unspecified hand: Secondary | ICD-10-CM | POA: Diagnosis not present

## 2021-04-08 DIAGNOSIS — M169 Osteoarthritis of hip, unspecified: Secondary | ICD-10-CM | POA: Diagnosis not present

## 2021-04-08 DIAGNOSIS — E039 Hypothyroidism, unspecified: Secondary | ICD-10-CM | POA: Diagnosis not present

## 2021-05-03 DIAGNOSIS — Z23 Encounter for immunization: Secondary | ICD-10-CM | POA: Diagnosis not present

## 2021-05-03 DIAGNOSIS — L821 Other seborrheic keratosis: Secondary | ICD-10-CM | POA: Diagnosis not present

## 2021-05-03 DIAGNOSIS — D225 Melanocytic nevi of trunk: Secondary | ICD-10-CM | POA: Diagnosis not present

## 2021-05-03 DIAGNOSIS — L814 Other melanin hyperpigmentation: Secondary | ICD-10-CM | POA: Diagnosis not present

## 2021-05-03 DIAGNOSIS — L409 Psoriasis, unspecified: Secondary | ICD-10-CM | POA: Diagnosis not present

## 2021-05-03 DIAGNOSIS — Z79899 Other long term (current) drug therapy: Secondary | ICD-10-CM | POA: Diagnosis not present

## 2021-05-06 DIAGNOSIS — H18593 Other hereditary corneal dystrophies, bilateral: Secondary | ICD-10-CM | POA: Diagnosis not present

## 2021-05-06 DIAGNOSIS — H25013 Cortical age-related cataract, bilateral: Secondary | ICD-10-CM | POA: Diagnosis not present

## 2021-05-06 DIAGNOSIS — H2513 Age-related nuclear cataract, bilateral: Secondary | ICD-10-CM | POA: Diagnosis not present

## 2021-05-06 DIAGNOSIS — H353131 Nonexudative age-related macular degeneration, bilateral, early dry stage: Secondary | ICD-10-CM | POA: Diagnosis not present

## 2021-05-06 DIAGNOSIS — H2511 Age-related nuclear cataract, right eye: Secondary | ICD-10-CM | POA: Diagnosis not present

## 2021-05-28 ENCOUNTER — Other Ambulatory Visit: Payer: Self-pay | Admitting: Family Medicine

## 2021-05-28 DIAGNOSIS — Z1231 Encounter for screening mammogram for malignant neoplasm of breast: Secondary | ICD-10-CM

## 2021-06-29 DIAGNOSIS — R69 Illness, unspecified: Secondary | ICD-10-CM | POA: Diagnosis not present

## 2021-06-29 DIAGNOSIS — I10 Essential (primary) hypertension: Secondary | ICD-10-CM | POA: Diagnosis not present

## 2021-06-29 DIAGNOSIS — M8580 Other specified disorders of bone density and structure, unspecified site: Secondary | ICD-10-CM | POA: Diagnosis not present

## 2021-06-29 DIAGNOSIS — E039 Hypothyroidism, unspecified: Secondary | ICD-10-CM | POA: Diagnosis not present

## 2021-07-08 ENCOUNTER — Other Ambulatory Visit: Payer: Self-pay

## 2021-07-08 ENCOUNTER — Ambulatory Visit
Admission: RE | Admit: 2021-07-08 | Discharge: 2021-07-08 | Disposition: A | Discharge status: Home / Self Care | Payer: Medicare HMO | Source: Ambulatory Visit | Attending: Family Medicine | Admitting: Family Medicine

## 2021-07-08 DIAGNOSIS — Z1231 Encounter for screening mammogram for malignant neoplasm of breast: Secondary | ICD-10-CM

## 2021-07-14 DIAGNOSIS — L409 Psoriasis, unspecified: Secondary | ICD-10-CM | POA: Diagnosis not present

## 2021-07-14 DIAGNOSIS — R69 Illness, unspecified: Secondary | ICD-10-CM | POA: Diagnosis not present

## 2021-07-14 DIAGNOSIS — M8580 Other specified disorders of bone density and structure, unspecified site: Secondary | ICD-10-CM | POA: Diagnosis not present

## 2021-07-14 DIAGNOSIS — Z0000 Encounter for general adult medical examination without abnormal findings: Secondary | ICD-10-CM | POA: Diagnosis not present

## 2021-07-14 DIAGNOSIS — H9190 Unspecified hearing loss, unspecified ear: Secondary | ICD-10-CM | POA: Diagnosis not present

## 2021-07-14 DIAGNOSIS — R32 Unspecified urinary incontinence: Secondary | ICD-10-CM | POA: Diagnosis not present

## 2021-07-14 DIAGNOSIS — E039 Hypothyroidism, unspecified: Secondary | ICD-10-CM | POA: Diagnosis not present

## 2021-07-14 DIAGNOSIS — I10 Essential (primary) hypertension: Secondary | ICD-10-CM | POA: Diagnosis not present

## 2021-07-28 DIAGNOSIS — H2511 Age-related nuclear cataract, right eye: Secondary | ICD-10-CM | POA: Diagnosis not present

## 2021-07-29 DIAGNOSIS — H2512 Age-related nuclear cataract, left eye: Secondary | ICD-10-CM | POA: Diagnosis not present

## 2021-08-11 DIAGNOSIS — H2512 Age-related nuclear cataract, left eye: Secondary | ICD-10-CM | POA: Diagnosis not present

## 2021-08-20 DIAGNOSIS — E039 Hypothyroidism, unspecified: Secondary | ICD-10-CM | POA: Diagnosis not present

## 2021-09-09 DIAGNOSIS — Z0100 Encounter for examination of eyes and vision without abnormal findings: Secondary | ICD-10-CM | POA: Diagnosis not present

## 2021-10-27 DIAGNOSIS — Z79899 Other long term (current) drug therapy: Secondary | ICD-10-CM | POA: Diagnosis not present

## 2021-10-27 DIAGNOSIS — L409 Psoriasis, unspecified: Secondary | ICD-10-CM | POA: Diagnosis not present

## 2021-12-01 DIAGNOSIS — R350 Frequency of micturition: Secondary | ICD-10-CM | POA: Diagnosis not present

## 2021-12-01 DIAGNOSIS — N3946 Mixed incontinence: Secondary | ICD-10-CM | POA: Diagnosis not present

## 2021-12-01 DIAGNOSIS — R351 Nocturia: Secondary | ICD-10-CM | POA: Diagnosis not present

## 2021-12-24 DIAGNOSIS — N393 Stress incontinence (female) (male): Secondary | ICD-10-CM | POA: Diagnosis not present

## 2022-01-18 DIAGNOSIS — J309 Allergic rhinitis, unspecified: Secondary | ICD-10-CM | POA: Diagnosis not present

## 2022-01-18 DIAGNOSIS — Z791 Long term (current) use of non-steroidal anti-inflammatories (NSAID): Secondary | ICD-10-CM | POA: Diagnosis not present

## 2022-01-18 DIAGNOSIS — R32 Unspecified urinary incontinence: Secondary | ICD-10-CM | POA: Diagnosis not present

## 2022-01-18 DIAGNOSIS — M1990 Unspecified osteoarthritis, unspecified site: Secondary | ICD-10-CM | POA: Diagnosis not present

## 2022-01-18 DIAGNOSIS — Z8249 Family history of ischemic heart disease and other diseases of the circulatory system: Secondary | ICD-10-CM | POA: Diagnosis not present

## 2022-01-18 DIAGNOSIS — R69 Illness, unspecified: Secondary | ICD-10-CM | POA: Diagnosis not present

## 2022-01-18 DIAGNOSIS — Z825 Family history of asthma and other chronic lower respiratory diseases: Secondary | ICD-10-CM | POA: Diagnosis not present

## 2022-01-18 DIAGNOSIS — E039 Hypothyroidism, unspecified: Secondary | ICD-10-CM | POA: Diagnosis not present

## 2022-01-18 DIAGNOSIS — I10 Essential (primary) hypertension: Secondary | ICD-10-CM | POA: Diagnosis not present

## 2022-01-18 DIAGNOSIS — K5900 Constipation, unspecified: Secondary | ICD-10-CM | POA: Diagnosis not present

## 2022-01-18 DIAGNOSIS — L400 Psoriasis vulgaris: Secondary | ICD-10-CM | POA: Diagnosis not present

## 2022-01-18 DIAGNOSIS — Z809 Family history of malignant neoplasm, unspecified: Secondary | ICD-10-CM | POA: Diagnosis not present

## 2022-02-10 DIAGNOSIS — N3946 Mixed incontinence: Secondary | ICD-10-CM | POA: Diagnosis not present

## 2022-02-10 DIAGNOSIS — R350 Frequency of micturition: Secondary | ICD-10-CM | POA: Diagnosis not present

## 2022-03-23 DIAGNOSIS — N8110 Cystocele, unspecified: Secondary | ICD-10-CM | POA: Diagnosis not present

## 2022-03-23 DIAGNOSIS — N3946 Mixed incontinence: Secondary | ICD-10-CM | POA: Diagnosis not present

## 2022-04-26 DIAGNOSIS — N3946 Mixed incontinence: Secondary | ICD-10-CM | POA: Diagnosis not present

## 2022-05-04 DIAGNOSIS — L821 Other seborrheic keratosis: Secondary | ICD-10-CM | POA: Diagnosis not present

## 2022-05-04 DIAGNOSIS — L814 Other melanin hyperpigmentation: Secondary | ICD-10-CM | POA: Diagnosis not present

## 2022-05-04 DIAGNOSIS — D225 Melanocytic nevi of trunk: Secondary | ICD-10-CM | POA: Diagnosis not present

## 2022-05-04 DIAGNOSIS — L409 Psoriasis, unspecified: Secondary | ICD-10-CM | POA: Diagnosis not present

## 2022-05-04 DIAGNOSIS — Z79899 Other long term (current) drug therapy: Secondary | ICD-10-CM | POA: Diagnosis not present

## 2022-05-25 ENCOUNTER — Ambulatory Visit: Payer: Medicare HMO | Admitting: Podiatry

## 2022-06-07 DIAGNOSIS — M19041 Primary osteoarthritis, right hand: Secondary | ICD-10-CM | POA: Diagnosis not present

## 2022-06-07 DIAGNOSIS — Z6827 Body mass index (BMI) 27.0-27.9, adult: Secondary | ICD-10-CM | POA: Diagnosis not present

## 2022-06-07 DIAGNOSIS — M1990 Unspecified osteoarthritis, unspecified site: Secondary | ICD-10-CM | POA: Diagnosis not present

## 2022-06-10 ENCOUNTER — Other Ambulatory Visit: Payer: Self-pay | Admitting: Family Medicine

## 2022-06-10 DIAGNOSIS — Z1231 Encounter for screening mammogram for malignant neoplasm of breast: Secondary | ICD-10-CM

## 2022-06-28 DIAGNOSIS — G4700 Insomnia, unspecified: Secondary | ICD-10-CM | POA: Diagnosis not present

## 2022-06-28 DIAGNOSIS — I10 Essential (primary) hypertension: Secondary | ICD-10-CM | POA: Diagnosis not present

## 2022-06-28 DIAGNOSIS — E039 Hypothyroidism, unspecified: Secondary | ICD-10-CM | POA: Diagnosis not present

## 2022-06-28 DIAGNOSIS — R69 Illness, unspecified: Secondary | ICD-10-CM | POA: Diagnosis not present

## 2022-06-28 DIAGNOSIS — M8580 Other specified disorders of bone density and structure, unspecified site: Secondary | ICD-10-CM | POA: Diagnosis not present

## 2022-07-21 DIAGNOSIS — H9190 Unspecified hearing loss, unspecified ear: Secondary | ICD-10-CM | POA: Diagnosis not present

## 2022-07-21 DIAGNOSIS — M8580 Other specified disorders of bone density and structure, unspecified site: Secondary | ICD-10-CM | POA: Diagnosis not present

## 2022-07-21 DIAGNOSIS — L409 Psoriasis, unspecified: Secondary | ICD-10-CM | POA: Diagnosis not present

## 2022-07-21 DIAGNOSIS — Z136 Encounter for screening for cardiovascular disorders: Secondary | ICD-10-CM | POA: Diagnosis not present

## 2022-07-21 DIAGNOSIS — R69 Illness, unspecified: Secondary | ICD-10-CM | POA: Diagnosis not present

## 2022-07-21 DIAGNOSIS — I10 Essential (primary) hypertension: Secondary | ICD-10-CM | POA: Diagnosis not present

## 2022-07-21 DIAGNOSIS — Z13220 Encounter for screening for lipoid disorders: Secondary | ICD-10-CM | POA: Diagnosis not present

## 2022-07-21 DIAGNOSIS — M79645 Pain in left finger(s): Secondary | ICD-10-CM | POA: Diagnosis not present

## 2022-07-21 DIAGNOSIS — E039 Hypothyroidism, unspecified: Secondary | ICD-10-CM | POA: Diagnosis not present

## 2022-07-21 DIAGNOSIS — Z0000 Encounter for general adult medical examination without abnormal findings: Secondary | ICD-10-CM | POA: Diagnosis not present

## 2022-07-21 DIAGNOSIS — N3281 Overactive bladder: Secondary | ICD-10-CM | POA: Diagnosis not present

## 2022-07-21 DIAGNOSIS — Z6827 Body mass index (BMI) 27.0-27.9, adult: Secondary | ICD-10-CM | POA: Diagnosis not present

## 2022-07-25 DIAGNOSIS — M79645 Pain in left finger(s): Secondary | ICD-10-CM | POA: Diagnosis not present

## 2022-07-29 ENCOUNTER — Ambulatory Visit: Payer: Medicare HMO

## 2022-09-01 DIAGNOSIS — N3946 Mixed incontinence: Secondary | ICD-10-CM | POA: Diagnosis not present

## 2022-09-01 DIAGNOSIS — R350 Frequency of micturition: Secondary | ICD-10-CM | POA: Diagnosis not present

## 2022-09-13 ENCOUNTER — Ambulatory Visit
Admission: RE | Admit: 2022-09-13 | Discharge: 2022-09-13 | Disposition: A | Discharge status: Home / Self Care | Payer: Medicare HMO | Source: Ambulatory Visit | Attending: Family Medicine | Admitting: Family Medicine

## 2022-09-13 DIAGNOSIS — Z1231 Encounter for screening mammogram for malignant neoplasm of breast: Secondary | ICD-10-CM | POA: Diagnosis not present

## 2022-11-08 DIAGNOSIS — G4733 Obstructive sleep apnea (adult) (pediatric): Secondary | ICD-10-CM | POA: Diagnosis not present

## 2022-11-08 DIAGNOSIS — I10 Essential (primary) hypertension: Secondary | ICD-10-CM | POA: Diagnosis not present

## 2022-11-08 DIAGNOSIS — R600 Localized edema: Secondary | ICD-10-CM | POA: Diagnosis not present

## 2022-11-08 DIAGNOSIS — Z6828 Body mass index (BMI) 28.0-28.9, adult: Secondary | ICD-10-CM | POA: Diagnosis not present

## 2022-11-09 DIAGNOSIS — H43813 Vitreous degeneration, bilateral: Secondary | ICD-10-CM | POA: Diagnosis not present

## 2022-11-30 DIAGNOSIS — Z23 Encounter for immunization: Secondary | ICD-10-CM | POA: Diagnosis not present

## 2022-12-21 DIAGNOSIS — Z79899 Other long term (current) drug therapy: Secondary | ICD-10-CM | POA: Diagnosis not present

## 2022-12-21 DIAGNOSIS — L409 Psoriasis, unspecified: Secondary | ICD-10-CM | POA: Diagnosis not present

## 2022-12-22 DIAGNOSIS — N393 Stress incontinence (female) (male): Secondary | ICD-10-CM | POA: Diagnosis not present

## 2022-12-22 DIAGNOSIS — L400 Psoriasis vulgaris: Secondary | ICD-10-CM | POA: Diagnosis not present

## 2022-12-22 DIAGNOSIS — M1990 Unspecified osteoarthritis, unspecified site: Secondary | ICD-10-CM | POA: Diagnosis not present

## 2022-12-22 DIAGNOSIS — Z87891 Personal history of nicotine dependence: Secondary | ICD-10-CM | POA: Diagnosis not present

## 2022-12-22 DIAGNOSIS — Z974 Presence of external hearing-aid: Secondary | ICD-10-CM | POA: Diagnosis not present

## 2022-12-22 DIAGNOSIS — F325 Major depressive disorder, single episode, in full remission: Secondary | ICD-10-CM | POA: Diagnosis not present

## 2022-12-22 DIAGNOSIS — J301 Allergic rhinitis due to pollen: Secondary | ICD-10-CM | POA: Diagnosis not present

## 2022-12-22 DIAGNOSIS — K5900 Constipation, unspecified: Secondary | ICD-10-CM | POA: Diagnosis not present

## 2022-12-22 DIAGNOSIS — Z008 Encounter for other general examination: Secondary | ICD-10-CM | POA: Diagnosis not present

## 2022-12-22 DIAGNOSIS — G4730 Sleep apnea, unspecified: Secondary | ICD-10-CM | POA: Diagnosis not present

## 2022-12-22 DIAGNOSIS — I10 Essential (primary) hypertension: Secondary | ICD-10-CM | POA: Diagnosis not present

## 2022-12-22 DIAGNOSIS — E039 Hypothyroidism, unspecified: Secondary | ICD-10-CM | POA: Diagnosis not present

## 2022-12-22 DIAGNOSIS — F419 Anxiety disorder, unspecified: Secondary | ICD-10-CM | POA: Diagnosis not present

## 2023-05-31 DIAGNOSIS — L821 Other seborrheic keratosis: Secondary | ICD-10-CM | POA: Diagnosis not present

## 2023-05-31 DIAGNOSIS — D225 Melanocytic nevi of trunk: Secondary | ICD-10-CM | POA: Diagnosis not present

## 2023-05-31 DIAGNOSIS — L409 Psoriasis, unspecified: Secondary | ICD-10-CM | POA: Diagnosis not present

## 2023-05-31 DIAGNOSIS — Z79899 Other long term (current) drug therapy: Secondary | ICD-10-CM | POA: Diagnosis not present

## 2023-05-31 DIAGNOSIS — L814 Other melanin hyperpigmentation: Secondary | ICD-10-CM | POA: Diagnosis not present

## 2023-06-30 DIAGNOSIS — M436 Torticollis: Secondary | ICD-10-CM | POA: Diagnosis not present

## 2023-08-04 ENCOUNTER — Other Ambulatory Visit: Payer: Self-pay | Admitting: Family Medicine

## 2023-08-04 DIAGNOSIS — Z1231 Encounter for screening mammogram for malignant neoplasm of breast: Secondary | ICD-10-CM

## 2023-08-10 DIAGNOSIS — E039 Hypothyroidism, unspecified: Secondary | ICD-10-CM | POA: Diagnosis not present

## 2023-08-10 DIAGNOSIS — E7800 Pure hypercholesterolemia, unspecified: Secondary | ICD-10-CM | POA: Diagnosis not present

## 2023-08-10 DIAGNOSIS — I10 Essential (primary) hypertension: Secondary | ICD-10-CM | POA: Diagnosis not present

## 2023-08-14 ENCOUNTER — Other Ambulatory Visit: Payer: Self-pay | Admitting: Family Medicine

## 2023-08-14 DIAGNOSIS — M8580 Other specified disorders of bone density and structure, unspecified site: Secondary | ICD-10-CM

## 2023-09-12 DIAGNOSIS — R350 Frequency of micturition: Secondary | ICD-10-CM | POA: Diagnosis not present

## 2023-09-12 DIAGNOSIS — N3946 Mixed incontinence: Secondary | ICD-10-CM | POA: Diagnosis not present

## 2023-09-15 ENCOUNTER — Ambulatory Visit
Admission: RE | Admit: 2023-09-15 | Discharge: 2023-09-15 | Disposition: A | Discharge status: Home / Self Care | Source: Ambulatory Visit | Attending: Family Medicine | Admitting: Family Medicine

## 2023-09-15 DIAGNOSIS — Z1231 Encounter for screening mammogram for malignant neoplasm of breast: Secondary | ICD-10-CM

## 2023-11-30 ENCOUNTER — Ambulatory Visit: Admitting: Podiatry

## 2023-11-30 ENCOUNTER — Encounter: Payer: Self-pay | Admitting: Podiatry

## 2023-11-30 ENCOUNTER — Ambulatory Visit (INDEPENDENT_AMBULATORY_CARE_PROVIDER_SITE_OTHER)

## 2023-11-30 DIAGNOSIS — M779 Enthesopathy, unspecified: Secondary | ICD-10-CM

## 2023-11-30 DIAGNOSIS — M21611 Bunion of right foot: Secondary | ICD-10-CM

## 2023-12-01 NOTE — Progress Notes (Signed)
 Subjective:   Patient ID: Maria Cortez, female   DOB: 78 y.o.   MRN: [id number]   HPI Patient presents stating that her orthotics have gotten old and she is trying to stay very active and walk every day and needs new devices.  Patient does not smoke and does like to be active   Review of Systems  All other systems reviewed and are negative.       Objective:  Physical Exam Vitals and nursing note reviewed.  Constitutional:      Appearance: She is well-developed.  Pulmonary:     Effort: Pulmonary effort is normal.  Musculoskeletal:        General: Normal range of motion.  Skin:    General: Skin is warm.  Neurological:     Mental Status: She is alert.     Neurovascular status was found to be intact muscle strength is adequate range of motion adequate moderate depression of the arch orthotics that have lost part of their support mechanism with patient noted to have long-term history of plantar fascial inflammation with activity     Assessment:  Plantar fasciitis bilateral of the very mild nature that is controlled well with orthotics     Plan:  H&P reviewed and patient is evaluated by pedorthist and is fitted for new orthotic devices.  Patient will be seen back when returned

## 2023-12-04 DIAGNOSIS — K5900 Constipation, unspecified: Secondary | ICD-10-CM | POA: Diagnosis not present

## 2023-12-13 ENCOUNTER — Telehealth: Payer: Self-pay

## 2023-12-13 DIAGNOSIS — L409 Psoriasis, unspecified: Secondary | ICD-10-CM | POA: Diagnosis not present

## 2023-12-13 DIAGNOSIS — Z79899 Other long term (current) drug therapy: Secondary | ICD-10-CM | POA: Diagnosis not present

## 2023-12-13 NOTE — Telephone Encounter (Signed)
 LVM to schedule orthotic fitting  Balance: $392.00

## 2023-12-13 NOTE — Telephone Encounter (Signed)
 Patient called back, and the message was relayed. Appointment is scheduled for 12/28/23 at 11:00 AM.

## 2023-12-21 DIAGNOSIS — Z809 Family history of malignant neoplasm, unspecified: Secondary | ICD-10-CM | POA: Diagnosis not present

## 2023-12-21 DIAGNOSIS — F419 Anxiety disorder, unspecified: Secondary | ICD-10-CM | POA: Diagnosis not present

## 2023-12-21 DIAGNOSIS — M8580 Other specified disorders of bone density and structure, unspecified site: Secondary | ICD-10-CM | POA: Diagnosis not present

## 2023-12-21 DIAGNOSIS — Z87891 Personal history of nicotine dependence: Secondary | ICD-10-CM | POA: Diagnosis not present

## 2023-12-21 DIAGNOSIS — J301 Allergic rhinitis due to pollen: Secondary | ICD-10-CM | POA: Diagnosis not present

## 2023-12-21 DIAGNOSIS — F325 Major depressive disorder, single episode, in full remission: Secondary | ICD-10-CM | POA: Diagnosis not present

## 2023-12-21 DIAGNOSIS — Z791 Long term (current) use of non-steroidal anti-inflammatories (NSAID): Secondary | ICD-10-CM | POA: Diagnosis not present

## 2023-12-21 DIAGNOSIS — M1990 Unspecified osteoarthritis, unspecified site: Secondary | ICD-10-CM | POA: Diagnosis not present

## 2023-12-21 DIAGNOSIS — E039 Hypothyroidism, unspecified: Secondary | ICD-10-CM | POA: Diagnosis not present

## 2023-12-21 DIAGNOSIS — H3530 Unspecified macular degeneration: Secondary | ICD-10-CM | POA: Diagnosis not present

## 2023-12-21 DIAGNOSIS — I10 Essential (primary) hypertension: Secondary | ICD-10-CM | POA: Diagnosis not present

## 2023-12-21 DIAGNOSIS — E785 Hyperlipidemia, unspecified: Secondary | ICD-10-CM | POA: Diagnosis not present

## 2023-12-28 ENCOUNTER — Ambulatory Visit

## 2023-12-28 NOTE — Progress Notes (Signed)
 Patient presents today to pick up custom molded foot orthotics, diagnosed with Bunion  by Dr. Magdalen.   Orthotics were dispensed and fit was satisfactory. Reviewed instructions for break-in and wear. Written instructions given to patient.  Patient will follow up as needed.   Lolita Schultze CPed, CFo, CFm

## 2024-02-22 DIAGNOSIS — E663 Overweight: Secondary | ICD-10-CM | POA: Diagnosis not present

## 2024-02-22 DIAGNOSIS — Z6827 Body mass index (BMI) 27.0-27.9, adult: Secondary | ICD-10-CM | POA: Diagnosis not present

## 2024-02-22 DIAGNOSIS — M8580 Other specified disorders of bone density and structure, unspecified site: Secondary | ICD-10-CM | POA: Diagnosis not present

## 2024-02-22 DIAGNOSIS — Z23 Encounter for immunization: Secondary | ICD-10-CM | POA: Diagnosis not present

## 2024-02-22 DIAGNOSIS — F3341 Major depressive disorder, recurrent, in partial remission: Secondary | ICD-10-CM | POA: Diagnosis not present

## 2024-02-22 DIAGNOSIS — I10 Essential (primary) hypertension: Secondary | ICD-10-CM | POA: Diagnosis not present

## 2024-02-22 DIAGNOSIS — E039 Hypothyroidism, unspecified: Secondary | ICD-10-CM | POA: Diagnosis not present

## 2024-03-18 DIAGNOSIS — Z79899 Other long term (current) drug therapy: Secondary | ICD-10-CM | POA: Diagnosis not present

## 2024-03-18 DIAGNOSIS — L409 Psoriasis, unspecified: Secondary | ICD-10-CM | POA: Diagnosis not present

## 2024-04-01 ENCOUNTER — Emergency Department (HOSPITAL_BASED_OUTPATIENT_CLINIC_OR_DEPARTMENT_OTHER)

## 2024-04-01 ENCOUNTER — Other Ambulatory Visit: Payer: Self-pay

## 2024-04-01 ENCOUNTER — Encounter (HOSPITAL_BASED_OUTPATIENT_CLINIC_OR_DEPARTMENT_OTHER): Payer: Self-pay | Admitting: Emergency Medicine

## 2024-04-01 ENCOUNTER — Emergency Department (HOSPITAL_BASED_OUTPATIENT_CLINIC_OR_DEPARTMENT_OTHER)
Admission: EM | Admit: 2024-04-01 | Discharge: 2024-04-01 | Disposition: A | Discharge status: Home / Self Care | Source: Ambulatory Visit | Attending: Emergency Medicine | Admitting: Emergency Medicine

## 2024-04-01 DIAGNOSIS — S0083XA Contusion of other part of head, initial encounter: Secondary | ICD-10-CM | POA: Diagnosis not present

## 2024-04-01 DIAGNOSIS — R9082 White matter disease, unspecified: Secondary | ICD-10-CM | POA: Diagnosis not present

## 2024-04-01 DIAGNOSIS — R42 Dizziness and giddiness: Secondary | ICD-10-CM | POA: Diagnosis not present

## 2024-04-01 DIAGNOSIS — W108XXA Fall (on) (from) other stairs and steps, initial encounter: Secondary | ICD-10-CM | POA: Insufficient documentation

## 2024-04-01 DIAGNOSIS — W19XXXA Unspecified fall, initial encounter: Secondary | ICD-10-CM

## 2024-04-01 DIAGNOSIS — S0990XA Unspecified injury of head, initial encounter: Secondary | ICD-10-CM | POA: Diagnosis not present

## 2024-04-01 DIAGNOSIS — S0993XA Unspecified injury of face, initial encounter: Secondary | ICD-10-CM | POA: Diagnosis not present

## 2024-04-01 DIAGNOSIS — R519 Headache, unspecified: Secondary | ICD-10-CM | POA: Diagnosis not present

## 2024-04-01 LAB — CBC
HCT: 38 % (ref 36.0–46.0)
Hemoglobin: 12.5 g/dL (ref 12.0–15.0)
MCH: 30.5 pg (ref 26.0–34.0)
MCHC: 32.9 g/dL (ref 30.0–36.0)
MCV: 92.7 fL (ref 80.0–100.0)
Platelets: 263 K/uL (ref 150–400)
RBC: 4.1 MIL/uL (ref 3.87–5.11)
RDW: 13.1 % (ref 11.5–15.5)
WBC: 7.3 K/uL (ref 4.0–10.5)
nRBC: 0 % (ref 0.0–0.2)

## 2024-04-01 LAB — COMPREHENSIVE METABOLIC PANEL WITH GFR
ALT: 43 U/L (ref 0–44)
AST: 30 U/L (ref 15–41)
Albumin: 4.4 g/dL (ref 3.5–5.0)
Alkaline Phosphatase: 145 U/L — ABNORMAL HIGH (ref 38–126)
Anion gap: 9 (ref 5–15)
BUN: 16 mg/dL (ref 8–23)
CO2: 30 mmol/L (ref 22–32)
Calcium: 10.4 mg/dL — ABNORMAL HIGH (ref 8.9–10.3)
Chloride: 104 mmol/L (ref 98–111)
Creatinine, Ser: 0.82 mg/dL (ref 0.44–1.00)
GFR, Estimated: >60 mL/min (ref >60)
Glucose, Bld: 134 mg/dL — ABNORMAL HIGH (ref 70–99)
Potassium: 4 mmol/L (ref 3.5–5.1)
Sodium: 142 mmol/L (ref 135–145)
Total Bilirubin: 0.5 mg/dL (ref 0.0–1.2)
Total Protein: 7.4 g/dL (ref 6.5–8.1)

## 2024-04-01 LAB — URINALYSIS, ROUTINE W REFLEX MICROSCOPIC
Bilirubin Urine: NEGATIVE
Glucose, UA: NEGATIVE mg/dL
Hgb urine dipstick: NEGATIVE
Ketones, ur: NEGATIVE mg/dL
Nitrite: NEGATIVE
Protein, ur: NEGATIVE mg/dL
Specific Gravity, Urine: 1.023 (ref 1.005–1.030)
pH: 5.5 (ref 5.0–8.0)

## 2024-04-01 LAB — CBG MONITORING, ED: Glucose-Capillary: 134 mg/dL — ABNORMAL HIGH (ref 70–99)

## 2024-04-01 NOTE — Discharge Instructions (Signed)
 As we discussed, your labs, physical exam and imaging are reassuring. No significant abnormality is found to explain your symptoms. You can be discharged home and are encouraged to see your doctor in close office follow up until you return to your normal functioning.   Return to the ED with any new concerning symptoms at any time.

## 2024-04-01 NOTE — ED Provider Notes (Signed)
 [place] EMERGENCY DEPARTMENT AT [facility] Provider Note   CSN: [id number] Arrival date & time: 04/01/24  1315     Patient presents with: Fall and Dizziness   Maria Cortez is a 78 y.o. female.   Patient to ED for evaluation after fall that happened one week ago. At that time, she was walking down steps and lost her balance, falling forward to the ground, hitting the right side of her face causing bruising from her glasses. The glasses did not break. Per husband, she was out for a few seconds and was able to get up with assistance after 4 or 5 minutes. Since the fall, she has felt sluggish, not like herself, walking more slowly. She has headaches that resolve with Tylenol. No nausea, vomiting, visual changes, chest pain, abdominal pain or extremity injury. She also denies neck pain. She is not anticoagulated. She states she has infrequent dizzy spells that occur when she turns when ambulating.  The history is provided by the patient. No language interpreter was used.  Fall  Dizziness      Prior to Admission medications   Medication Sig Start Date End Date Taking? Authorizing Provider  acitretin (SORIATANE) 10 MG capsule Take 10 mg by mouth every other day. 03/18/24  Yes [provider]  GEMTESA 75 MG TABS Take 1 tablet by mouth daily. 12/26/23  Yes [provider]  solifenacin (VESICARE) 10 MG tablet Take 10 mg by mouth daily. 01/18/24  Yes [provider]  triamterene-hydrochlorothiazide (MAXZIDE-25) 37.5-25 MG tablet Take 1 tablet by mouth every morning. 01/19/24  Yes [provider]  acitretin (SORIATANE) 25 MG capsule  01/31/19   [provider]  atenolol (TENORMIN) 25 MG tablet Take 12.5 mg by mouth 2 (two) times daily.    [provider]  Calcium Carb-Cholecalciferol (CALCIUM + D3) 600-200 MG-UNIT TABS Take by mouth daily.    [provider]  Ciclopirox 1 % shampoo Apply topically at bedtime.     [provider]  diclofenac sodium (VOLTAREN) 1 % GEL Apply 4 g topically 4 (four) times daily.    [provider]  fluocinonide (LIDEX) 0.05 % external solution  11/26/18   [provider]  fluticasone (FLONASE) 50 MCG/ACT nasal spray Place 2 sprays into both nostrils daily.    [provider]  Glucosamine 750 MG TABS Take by mouth 2 (two) times daily.    [provider]  levothyroxine (SYNTHROID) 112 MCG tablet  12/17/18   [provider]  meloxicam  (MOBIC ) 15 MG tablet Take 1 tablet (15 mg total) by mouth daily. 07/24/14   Magdalen Pasco RAMAN, DPM  mometasone (NASONEX) 50 MCG/ACT nasal spray Place 2 sprays into the nose daily.    [provider]  Multiple Vitamins-Minerals (MULTIVITAMIN PO) Take by mouth daily.    [provider]  naproxen sodium (ANAPROX) 220 MG tablet Take 220 mg by mouth 2 (two) times daily with a meal.    [provider]  nortriptyline (PAMELOR) 75 MG capsule Take 75 mg by mouth at bedtime.    [provider]  Psyllium (METAMUCIL) 30.9 % POWD Take by mouth as needed.    [provider]  ramipril (ALTACE) 10 MG capsule Take 10 mg by mouth daily.    [provider]  TRIAMTERENE-HCTZ PO Take by mouth. Take 1/2 tab daily    [provider]    Allergies: Patient has no known allergies.    Review of Systems  Neurological:  Positive for dizziness.    Updated Vital Signs BP (!) 162/65 (BP Location: Left Arm)   Pulse 82   Temp 97.9 F (36.6 C)   Resp 18   SpO2 98%   Physical Exam Vitals and nursing note reviewed.  Constitutional:      General: She is not in acute distress.    Appearance: Normal appearance. She is well-developed.  HENT:     Head: Normocephalic.     Comments: Small bruise to right cheek that is resolving.     Nose: Nose normal.     Mouth/Throat:     Mouth: Mucous membranes are moist.  Eyes:     Extraocular Movements: Extraocular movements  intact.     Pupils: Pupils are equal, round, and reactive to light.  Neck:     Comments: No midline cervical tenderness.  Cardiovascular:     Rate and Rhythm: Normal rate and regular rhythm.     Heart sounds: No murmur heard. Pulmonary:     Effort: Pulmonary effort is normal.     Breath sounds: Normal breath sounds. No wheezing, rhonchi or rales.  Abdominal:     Palpations: Abdomen is soft.     Tenderness: There is no abdominal tenderness. There is no guarding or rebound.  Musculoskeletal:        General: Normal range of motion.     Cervical back: Normal range of motion and neck supple.  Skin:    General: Skin is warm and dry.  Neurological:     General: No focal deficit present.     Mental Status: She is alert and oriented to person, place, and time.     GCS: GCS eye subscore is 4. GCS verbal subscore is 5. GCS motor subscore is 6.     Cranial Nerves: Cranial nerves 2-12 are intact. No dysarthria or facial asymmetry.     Sensory: Sensation is intact.     Motor: Motor function is intact. No abnormal muscle tone or pronator drift.     Coordination: Coordination is intact. Finger-Nose-Finger Test normal.     Gait: Gait is intact.     Deep Tendon Reflexes:     Reflex Scores:      Patellar reflexes are 2+ on the right side and 2+ on the left side.    (all labs ordered are listed, but only abnormal results are displayed) Labs Reviewed  COMPREHENSIVE METABOLIC PANEL WITH GFR - Abnormal; Notable for the following components:      Result Value   Glucose, Bld 134 (*)    Calcium 10.4 (*)    Alkaline Phosphatase 145 (*)    All other components within normal limits  URINALYSIS, ROUTINE W REFLEX MICROSCOPIC - Abnormal; Notable for the following components:   Leukocytes,Ua MODERATE (*)    Bacteria, UA RARE (*)    All other components within normal limits  CBG MONITORING, ED - Abnormal; Notable for the following components:   Glucose-Capillary 134 (*)    All other components within  normal limits  CBC    EKG: EKG Interpretation Date/Time:  Monday April 01 2024 13:51:35 EST Ventricular Rate:  80 PR Interval:  156 QRS Duration:  96 QT Interval:  388 QTC Calculation: 447 R Axis:   52  Text Interpretation: Normal sinus rhythm Confirmed by Jerrol Agent ([id number]) on 04/01/2024 2:51:35 PM  Radiology: CT Head Wo Contrast Result Date: 04/01/2024 CLINICAL DATA:  Fall from front steps a week ago, persistent dizziness, intermittent headaches EXAM: CT HEAD WITHOUT  CONTRAST CT CERVICAL SPINE WITHOUT CONTRAST TECHNIQUE: Multidetector CT imaging of the head and cervical spine was performed following the standard protocol without intravenous contrast. Multiplanar CT image reconstructions of the cervical spine were also generated. RADIATION DOSE REDUCTION: This exam was performed according to the departmental dose-optimization program which includes automated exposure control, adjustment of the mA and/or kV according to patient size and/or use of iterative reconstruction technique. COMPARISON:  None Available. FINDINGS: CT HEAD FINDINGS Brain: No evidence of acute infarction, hemorrhage, hydrocephalus, extra-axial collection or mass lesion/mass effect. Mild periventricular white matter hypodensity. Vascular: No hyperdense vessel or unexpected calcification. Skull: Normal. Negative for fracture or focal lesion. Sinuses/Orbits: No acute finding. Other: None. CT CERVICAL SPINE FINDINGS Alignment: Normal. Skull base and vertebrae: No acute fracture. No primary bone lesion or focal pathologic process. Soft tissues and spinal canal: No prevertebral fluid or swelling. No visible canal hematoma. Disc levels: Focally moderate disc space height loss and osteophytosis C6-C7 with otherwise mild multilevel cervical disc degenerative change. Upper chest: Negative. Other: None. IMPRESSION: 1. No acute intracranial pathology. Mild small-vessel white matter disease. 2. No fracture or static subluxation of the  cervical spine. 3. Focally moderate disc space height loss and osteophytosis C6-C7 with otherwise mild multilevel cervical disc degenerative change. Electronically Signed   By: Marolyn JONETTA Jaksch M.D.   On: 04/01/2024 15:23   CT Cervical Spine Wo Contrast Result Date: 04/01/2024 CLINICAL DATA:  Fall from front steps a week ago, persistent dizziness, intermittent headaches EXAM: CT HEAD WITHOUT CONTRAST CT CERVICAL SPINE WITHOUT CONTRAST TECHNIQUE: Multidetector CT imaging of the head and cervical spine was performed following the standard protocol without intravenous contrast. Multiplanar CT image reconstructions of the cervical spine were also generated. RADIATION DOSE REDUCTION: This exam was performed according to the departmental dose-optimization program which includes automated exposure control, adjustment of the mA and/or kV according to patient size and/or use of iterative reconstruction technique. COMPARISON:  None Available. FINDINGS: CT HEAD FINDINGS Brain: No evidence of acute infarction, hemorrhage, hydrocephalus, extra-axial collection or mass lesion/mass effect. Mild periventricular white matter hypodensity. Vascular: No hyperdense vessel or unexpected calcification. Skull: Normal. Negative for fracture or focal lesion. Sinuses/Orbits: No acute finding. Other: None. CT CERVICAL SPINE FINDINGS Alignment: Normal. Skull base and vertebrae: No acute fracture. No primary bone lesion or focal pathologic process. Soft tissues and spinal canal: No prevertebral fluid or swelling. No visible canal hematoma. Disc levels: Focally moderate disc space height loss and osteophytosis C6-C7 with otherwise mild multilevel cervical disc degenerative change. Upper chest: Negative. Other: None. IMPRESSION: 1. No acute intracranial pathology. Mild small-vessel white matter disease. 2. No fracture or static subluxation of the cervical spine. 3. Focally moderate disc space height loss and osteophytosis C6-C7 with otherwise  mild multilevel cervical disc degenerative change. Electronically Signed   By: Marolyn JONETTA Jaksch M.D.   On: 04/01/2024 15:23     Procedures   Medications Ordered in the ED - No data to display  Clinical Course as of 04/01/24 1612  Mon Apr 01, 2024  1512 Patient to ED one week after mechanical fall with c/o of not having returned to her baseline. Namely, still feels she is guarded when she walks, writing is slower. Her neurologic exam is reassuring. Head and neck CT pending.  [SU]  1607 CT head and c-spine are negative for acute findings. Labs reassuring. No evidence infection, anemia, renal dysfunction, or significant electrolyte abnormality. Discussed results with patient and husband. Reviewed with Dr. Jerrol. She is felt appropriate for  discharge home. Encouraged close follow up with PCP in the outpatient setting.  [SU]    Clinical Course User Index [SU] Odell Balls, PA-C                                 Medical Decision Making Amount and/or Complexity of Data Reviewed Labs: ordered. Radiology: ordered.        Final diagnoses:  Fall, initial encounter    ED Discharge Orders     None          Odell Balls, PA-C 04/01/24 1612    Jerrol Agent, MD 04/01/24 1735

## 2024-04-01 NOTE — ED Notes (Signed)
 Pt and provider aware of bp. Pt knows she needs to take her bp meds when she gets home.

## 2024-04-01 NOTE — ED Triage Notes (Signed)
 Fall off front steps over a week ago Now- still doesn't feel right Dizziness, having difficulty writing and email Intermittent headaches relieved with tylenol

## 2024-04-12 DIAGNOSIS — R2689 Other abnormalities of gait and mobility: Secondary | ICD-10-CM | POA: Diagnosis not present

## 2024-04-12 DIAGNOSIS — Z6826 Body mass index (BMI) 26.0-26.9, adult: Secondary | ICD-10-CM | POA: Diagnosis not present

## 2024-04-12 DIAGNOSIS — R42 Dizziness and giddiness: Secondary | ICD-10-CM | POA: Diagnosis not present

## 2024-04-12 DIAGNOSIS — R5381 Other malaise: Secondary | ICD-10-CM | POA: Diagnosis not present

## 2024-04-12 DIAGNOSIS — R479 Unspecified speech disturbances: Secondary | ICD-10-CM | POA: Diagnosis not present

## 2024-04-25 ENCOUNTER — Ambulatory Visit (HOSPITAL_BASED_OUTPATIENT_CLINIC_OR_DEPARTMENT_OTHER)
Admission: RE | Admit: 2024-04-25 | Discharge: 2024-04-25 | Disposition: A | Source: Ambulatory Visit | Attending: Family Medicine | Admitting: Family Medicine

## 2024-04-25 ENCOUNTER — Other Ambulatory Visit

## 2024-04-25 DIAGNOSIS — Z780 Asymptomatic menopausal state: Secondary | ICD-10-CM | POA: Diagnosis not present

## 2024-04-25 DIAGNOSIS — M8580 Other specified disorders of bone density and structure, unspecified site: Secondary | ICD-10-CM

## 2024-04-25 DIAGNOSIS — M8589 Other specified disorders of bone density and structure, multiple sites: Secondary | ICD-10-CM | POA: Insufficient documentation

## 2024-05-06 DIAGNOSIS — R5381 Other malaise: Secondary | ICD-10-CM | POA: Diagnosis not present

## 2024-05-06 DIAGNOSIS — R2681 Unsteadiness on feet: Secondary | ICD-10-CM | POA: Diagnosis not present

## 2024-05-06 DIAGNOSIS — R479 Unspecified speech disturbances: Secondary | ICD-10-CM | POA: Diagnosis not present

## 2024-05-06 DIAGNOSIS — R2689 Other abnormalities of gait and mobility: Secondary | ICD-10-CM | POA: Diagnosis not present

## 2024-05-07 DIAGNOSIS — R41841 Cognitive communication deficit: Secondary | ICD-10-CM | POA: Diagnosis not present

## 2024-05-07 DIAGNOSIS — R479 Unspecified speech disturbances: Secondary | ICD-10-CM | POA: Diagnosis not present

## 2024-05-09 DIAGNOSIS — R2689 Other abnormalities of gait and mobility: Secondary | ICD-10-CM | POA: Diagnosis not present

## 2024-05-09 DIAGNOSIS — R5381 Other malaise: Secondary | ICD-10-CM | POA: Diagnosis not present

## 2024-05-09 DIAGNOSIS — R479 Unspecified speech disturbances: Secondary | ICD-10-CM | POA: Diagnosis not present

## 2024-05-09 DIAGNOSIS — R2681 Unsteadiness on feet: Secondary | ICD-10-CM | POA: Diagnosis not present
